# Patient Record
Sex: Male | Born: 1980 | Race: Black or African American | Hispanic: No | Marital: Married | Smoking: Never smoker
Health system: Southern US, Community
[De-identification: ages and names within clinical notes are randomized; demographics above are authoritative.]

## PROBLEM LIST (undated history)

## (undated) DIAGNOSIS — K50919 Crohn's disease, unspecified, with unspecified complications: Secondary | ICD-10-CM

## (undated) DIAGNOSIS — K4090 Unilateral inguinal hernia, without obstruction or gangrene, not specified as recurrent: Secondary | ICD-10-CM

## (undated) DIAGNOSIS — J302 Other seasonal allergic rhinitis: Secondary | ICD-10-CM

## (undated) DIAGNOSIS — K5090 Crohn's disease, unspecified, without complications: Secondary | ICD-10-CM

## (undated) HISTORY — PX: COLONOSCOPY: SHX174

## (undated) HISTORY — DX: Crohn's disease, unspecified, with unspecified complications: K50.919

---

## 2008-06-26 ENCOUNTER — Ambulatory Visit: Payer: Self-pay | Admitting: Internal Medicine

## 2008-07-16 ENCOUNTER — Ambulatory Visit: Payer: Self-pay | Admitting: Internal Medicine

## 2008-07-26 ENCOUNTER — Ambulatory Visit: Payer: Self-pay | Admitting: Internal Medicine

## 2008-09-17 ENCOUNTER — Ambulatory Visit: Payer: Self-pay | Admitting: Internal Medicine

## 2008-10-26 HISTORY — PX: HERNIA REPAIR: SHX51

## 2008-11-30 ENCOUNTER — Ambulatory Visit: Payer: Self-pay | Admitting: Internal Medicine

## 2008-12-24 ENCOUNTER — Ambulatory Visit: Payer: Self-pay | Admitting: Internal Medicine

## 2009-01-24 ENCOUNTER — Ambulatory Visit: Payer: Self-pay | Admitting: Internal Medicine

## 2010-02-13 ENCOUNTER — Encounter: Admission: RE | Admit: 2010-02-13 | Discharge: 2010-02-13 | Payer: Self-pay | Admitting: Gastroenterology

## 2010-03-26 ENCOUNTER — Encounter: Admission: RE | Admit: 2010-03-26 | Discharge: 2010-03-26 | Payer: Self-pay | Admitting: Gastroenterology

## 2010-04-08 ENCOUNTER — Encounter (HOSPITAL_COMMUNITY)
Admission: RE | Admit: 2010-04-08 | Discharge: 2010-07-07 | Payer: Self-pay | Source: Home / Self Care | Admitting: Gastroenterology

## 2010-07-31 ENCOUNTER — Encounter (HOSPITAL_COMMUNITY)
Admission: RE | Admit: 2010-07-31 | Discharge: 2010-09-25 | Payer: Self-pay | Source: Home / Self Care | Admitting: Gastroenterology

## 2012-12-26 ENCOUNTER — Emergency Department (HOSPITAL_COMMUNITY)
Admission: EM | Admit: 2012-12-26 | Discharge: 2012-12-26 | Disposition: A | Discharge status: Home Health | Payer: No Typology Code available for payment source | Attending: Emergency Medicine | Admitting: Emergency Medicine

## 2012-12-26 ENCOUNTER — Encounter (HOSPITAL_COMMUNITY): Payer: Self-pay | Admitting: *Deleted

## 2012-12-26 DIAGNOSIS — S161XXA Strain of muscle, fascia and tendon at neck level, initial encounter: Secondary | ICD-10-CM

## 2012-12-26 DIAGNOSIS — Y92410 Unspecified street and highway as the place of occurrence of the external cause: Secondary | ICD-10-CM | POA: Insufficient documentation

## 2012-12-26 DIAGNOSIS — Z8719 Personal history of other diseases of the digestive system: Secondary | ICD-10-CM | POA: Insufficient documentation

## 2012-12-26 DIAGNOSIS — S3981XA Other specified injuries of abdomen, initial encounter: Secondary | ICD-10-CM | POA: Insufficient documentation

## 2012-12-26 DIAGNOSIS — S139XXA Sprain of joints and ligaments of unspecified parts of neck, initial encounter: Secondary | ICD-10-CM | POA: Insufficient documentation

## 2012-12-26 DIAGNOSIS — S39012A Strain of muscle, fascia and tendon of lower back, initial encounter: Secondary | ICD-10-CM

## 2012-12-26 DIAGNOSIS — S335XXA Sprain of ligaments of lumbar spine, initial encounter: Secondary | ICD-10-CM | POA: Insufficient documentation

## 2012-12-26 DIAGNOSIS — Y9389 Activity, other specified: Secondary | ICD-10-CM | POA: Insufficient documentation

## 2012-12-26 MED ORDER — IBUPROFEN 600 MG PO TABS: 600.0000 mg | ORAL_TABLET | Freq: Four times a day (QID) | ORAL | Status: DC | PRN

## 2012-12-26 MED ORDER — IBUPROFEN 800 MG PO TABS
800.0000 mg | ORAL_TABLET | Freq: Once | ORAL | Status: AC
  Administered 2012-12-26: 800 mg via ORAL
  Filled 2012-12-26: qty 1

## 2012-12-26 MED ORDER — OXYCODONE-ACETAMINOPHEN 5-325 MG PO TABS: 1.0000 | ORAL_TABLET | Freq: Four times a day (QID) | ORAL | Status: DC | PRN

## 2012-12-26 MED ORDER — DIAZEPAM 5 MG PO TABS: 5.0000 mg | ORAL_TABLET | Freq: Four times a day (QID) | ORAL | Status: DC | PRN
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
## 2013-11-02 ENCOUNTER — Other Ambulatory Visit: Payer: Self-pay | Admitting: Gastroenterology

## 2013-11-02 DIAGNOSIS — R634 Abnormal weight loss: Secondary | ICD-10-CM

## 2013-11-02 DIAGNOSIS — K5090 Crohn's disease, unspecified, without complications: Secondary | ICD-10-CM

## 2013-11-08 ENCOUNTER — Other Ambulatory Visit (HOSPITAL_COMMUNITY): Payer: Self-pay | Admitting: *Deleted

## 2013-11-08 ENCOUNTER — Other Ambulatory Visit: Payer: Self-pay

## 2013-11-09 ENCOUNTER — Encounter (HOSPITAL_COMMUNITY)
Admission: RE | Admit: 2013-11-09 | Discharge: 2013-11-09 | Disposition: A | Discharge status: Home / Self Care | Payer: 59 | Source: Ambulatory Visit | Attending: Gastroenterology | Admitting: Gastroenterology

## 2013-11-09 DIAGNOSIS — K5090 Crohn's disease, unspecified, without complications: Secondary | ICD-10-CM | POA: Insufficient documentation

## 2013-11-09 MED ORDER — SODIUM CHLORIDE 0.9 % IV SOLN
5.0000 mg/kg | INTRAVENOUS | Status: DC
  Administered 2013-11-09: 300 mg via INTRAVENOUS
  Filled 2013-11-09: qty 30

## 2013-11-09 MED ORDER — SODIUM CHLORIDE 0.9 % IV SOLN
INTRAVENOUS | Status: DC
  Administered 2013-11-09: 09:00:00 via INTRAVENOUS

## 2013-11-10 ENCOUNTER — Ambulatory Visit
Admission: RE | Admit: 2013-11-10 | Discharge: 2013-11-10 | Disposition: A | Discharge status: Home / Self Care | Payer: 59 | Source: Ambulatory Visit | Attending: Gastroenterology | Admitting: Gastroenterology

## 2013-11-10 DIAGNOSIS — K5090 Crohn's disease, unspecified, without complications: Secondary | ICD-10-CM

## 2013-11-10 DIAGNOSIS — R634 Abnormal weight loss: Secondary | ICD-10-CM

## 2013-11-10 MED ORDER — IOHEXOL 300 MG/ML  SOLN
100.0000 mL | Freq: Once | INTRAMUSCULAR | Status: AC | PRN
  Administered 2013-11-10: 100 mL via INTRAVENOUS

## 2013-11-23 ENCOUNTER — Encounter (HOSPITAL_COMMUNITY)
Admission: RE | Admit: 2013-11-23 | Discharge: 2013-11-23 | Disposition: A | Discharge status: Home / Self Care | Payer: 59 | Source: Ambulatory Visit | Attending: Gastroenterology | Admitting: Gastroenterology

## 2013-11-23 MED ORDER — SODIUM CHLORIDE 0.9 % IV SOLN
5.0000 mg/kg | INTRAVENOUS | Status: DC
  Administered 2013-11-23: 300 mg via INTRAVENOUS
  Filled 2013-11-23: qty 30

## 2013-11-23 MED ORDER — SODIUM CHLORIDE 0.9 % IV SOLN
INTRAVENOUS | Status: DC
  Administered 2013-11-23: 10:00:00 via INTRAVENOUS

## 2013-12-21 ENCOUNTER — Inpatient Hospital Stay (HOSPITAL_COMMUNITY): Admission: RE | Admit: 2013-12-21 | Payer: 59 | Source: Ambulatory Visit

## 2014-01-02 ENCOUNTER — Ambulatory Visit (INDEPENDENT_AMBULATORY_CARE_PROVIDER_SITE_OTHER): Payer: 59 | Admitting: General Surgery

## 2014-01-12 ENCOUNTER — Encounter (HOSPITAL_COMMUNITY)
Admission: RE | Admit: 2014-01-12 | Discharge: 2014-01-12 | Disposition: A | Discharge status: Home / Self Care | Payer: 59 | Source: Ambulatory Visit | Attending: Gastroenterology | Admitting: Gastroenterology

## 2014-01-12 DIAGNOSIS — K5090 Crohn's disease, unspecified, without complications: Secondary | ICD-10-CM | POA: Insufficient documentation

## 2014-01-12 MED ORDER — SODIUM CHLORIDE 0.9 % IV SOLN
5.0000 mg/kg | INTRAVENOUS | Status: AC
  Administered 2014-01-12: 300 mg via INTRAVENOUS
  Filled 2014-01-12: qty 30

## 2014-01-12 MED ORDER — SODIUM CHLORIDE 0.9 % IV SOLN
INTRAVENOUS | Status: AC
  Administered 2014-01-12: 10:00:00 via INTRAVENOUS

## 2014-01-16 ENCOUNTER — Ambulatory Visit (INDEPENDENT_AMBULATORY_CARE_PROVIDER_SITE_OTHER): Payer: 59 | Admitting: General Surgery

## 2014-01-24 DIAGNOSIS — K4090 Unilateral inguinal hernia, without obstruction or gangrene, not specified as recurrent: Secondary | ICD-10-CM

## 2014-01-24 HISTORY — DX: Unilateral inguinal hernia, without obstruction or gangrene, not specified as recurrent: K40.90

## 2014-01-29 ENCOUNTER — Ambulatory Visit (INDEPENDENT_AMBULATORY_CARE_PROVIDER_SITE_OTHER): Payer: 59 | Admitting: General Surgery

## 2014-01-29 ENCOUNTER — Encounter (INDEPENDENT_AMBULATORY_CARE_PROVIDER_SITE_OTHER): Payer: Self-pay | Admitting: General Surgery

## 2014-01-29 VITALS — BP 121/70 | HR 89 | Temp 98.1°F | Resp 16 | Ht 71.0 in | Wt 159.6 lb

## 2014-01-29 DIAGNOSIS — K5010 Crohn's disease of large intestine without complications: Secondary | ICD-10-CM | POA: Insufficient documentation

## 2014-01-29 DIAGNOSIS — K50111 Crohn's disease of large intestine with rectal bleeding: Secondary | ICD-10-CM

## 2014-01-29 DIAGNOSIS — K4090 Unilateral inguinal hernia, without obstruction or gangrene, not specified as recurrent: Secondary | ICD-10-CM | POA: Insufficient documentation

## 2014-01-29 HISTORY — DX: Crohn's disease of large intestine with rectal bleeding: K50.111

## 2014-01-29 NOTE — Patient Instructions (Signed)
You have a very large left inguinal hernia, and you should have this surgically repaired in the future.  Be sure to get the colonoscopy with Dr. Collene Mares on April 15.  Discuss with Dr. Collene Mares twhen would be an ideal time to have the hernia surgery, possibly can be done between Remicade infusions.  We will schedule the surgery at your convenience.    Inguinal Hernia, Adult Muscles help keep everything in the body in its proper place. But if a weak spot in the muscles develops, something can poke through. That is called a hernia. When this happens in the lower part of the belly (abdomen), it is called an inguinal hernia. (It takes its name from a part of the body in this region called the inguinal canal.) A weak spot in the wall of muscles lets some fat or part of the small intestine bulge through. An inguinal hernia can develop at any age. Men get them more often than women. CAUSES  In adults, an inguinal hernia develops over time.  It can be triggered by:  Suddenly straining the muscles of the lower abdomen.  Lifting heavy objects.  Straining to have a bowel movement. Difficult bowel movements (constipation) can lead to this.  Constant coughing. This may be caused by smoking or lung disease.  Being overweight.  Being pregnant.  Working at a job that requires long periods of standing or heavy lifting.  Having had an inguinal hernia before. One type can be an emergency situation. It is called a strangulated inguinal hernia. It develops if part of the small intestine slips through the weak spot and cannot get back into the abdomen. The blood supply can be cut off. If that happens, part of the intestine may die. This situation requires emergency surgery. SYMPTOMS  Often, a small inguinal hernia has no symptoms. It is found when a healthcare provider does a physical exam. Larger hernias usually have symptoms.   In adults, symptoms may include:  A lump in the groin. This is easier to see  when the person is standing. It might disappear when lying down.  In men, a lump in the scrotum.  Pain or burning in the groin. This occurs especially when lifting, straining or coughing.  A dull ache or feeling of pressure in the groin.  Signs of a strangulated hernia can include:  A bulge in the groin that becomes very painful and tender to the touch.  A bulge that turns red or purple.  Fever, nausea and vomiting.  Inability to have a bowel movement or to pass gas. DIAGNOSIS  To decide if you have an inguinal hernia, a healthcare provider will probably do a physical examination.  This will include asking questions about any symptoms you have noticed.  The healthcare provider might feel the groin area and ask you to cough. If an inguinal hernia is felt, the healthcare provider may try to slide it back into the abdomen.  Usually no other tests are needed. TREATMENT  Treatments can vary. The size of the hernia makes a difference. Options include:  Watchful waiting. This is often suggested if the hernia is small and you have had no symptoms.  No medical procedure will be done unless symptoms develop.  You will need to watch closely for symptoms. If any occur, contact your healthcare provider right away.  Surgery. This is used if the hernia is larger or you have symptoms.  Open surgery. This is usually an outpatient procedure (you will not stay overnight  in a hospital). An cut (incision) is made through the skin in the groin. The hernia is put back inside the abdomen. The weak area in the muscles is then repaired by herniorrhaphy or hernioplasty. Herniorrhaphy: in this type of surgery, the weak muscles are sewn back together. Hernioplasty: a patch or mesh is used to close the weak area in the abdominal wall.  Laparoscopy. In this procedure, a surgeon makes small incisions. A thin tube with a tiny video camera (called a laparoscope) is put into the abdomen. The surgeon repairs the  hernia with mesh by looking with the video camera and using two long instruments. HOME CARE INSTRUCTIONS   After surgery to repair an inguinal hernia:  You will need to take pain medicine prescribed by your healthcare provider. Follow all directions carefully.  You will need to take care of the wound from the incision.  Your activity will be restricted for awhile. This will probably include no heavy lifting for several weeks. You also should not do anything too active for a few weeks. When you can return to work will depend on the type of job that you have.  During "watchful waiting" periods, you should:  Maintain a healthy weight.  Eat a diet high in fiber (fruits, vegetables and whole grains).  Drink plenty of fluids to avoid constipation. This means drinking enough water and other liquids to keep your urine clear or pale yellow.  Do not lift heavy objects.  Do not stand for long periods of time.  Quit smoking. This should keep you from developing a frequent cough. SEEK MEDICAL CARE IF:   A bulge develops in your groin area.  You feel pain, a burning sensation or pressure in the groin. This might be worse if you are lifting or straining.  You develop a fever of more than 100.5 F (38.1 C). SEEK IMMEDIATE MEDICAL CARE IF:   Pain in the groin increases suddenly.  A bulge in the groin gets bigger suddenly and does not go down.  For men, there is sudden pain in the scrotum. Or, the size of the scrotum increases.  A bulge in the groin area becomes red or purple and is painful to touch.  You have nausea or vomiting that does not go away.  You feel your heart beating much faster than normal.  You cannot have a bowel movement or pass gas.  You develop a fever of more than 102.0 F (38.9 C). Document Released: 02/28/2009 Document Revised: 01/04/2012 Document Reviewed: 02/28/2009 [facility] Patient Information 2014 [place], [place].

## 2014-01-29 NOTE — Progress Notes (Signed)
Patient ID: Gregory Meyer, male   DOB: 1981/05/30, 33 y.o.   MRN: [id number]  Chief Complaint  Patient presents with  . Hernia    HPI Gregory Meyer is a 33 y.o. male.  He is referred by Gregory Athens, NP,  who works with Dr. Glendale Chard. He is referred for evaluation of a large left inguinal hernia.  This young man has Crohn's colitis, and is followed by Dr. Nelwyn Meyer. He gets Remicade infusions, which has helped his Crohn's symptoms a good deal.  He has a history of a right inguinal hernia repair in [place] which healed well. He has a two-year history of an enlarging left inguinal hernia, occasionally painful but always reducible.  Socially he is single, has a girlfriend, quit smoking 6 weeks ago, drinks alcohol twice a week. Operates a food truck.   No other medical problems. No other immunologic diseases. Denies history of hepatitis or HIV. HPI  Past Medical History  Diagnosis Date  . Crohn's colitis     Past Surgical History  Procedure Laterality Date  . Hernia repair    . Colonoscopy      History reviewed. No pertinent family history.  Social History History  Substance Use Topics  . Smoking status: Former Smoker    Quit date: 12/01/2013  . Smokeless tobacco: Not on file  . Alcohol Use: Yes     Comment: 2 x per week    No Known Allergies  Current Outpatient Prescriptions  Medication Sig Dispense Refill  . InFLIXimab (REMICADE IV) Inject 5 mg into the vein.       No current facility-administered medications for this visit.    Review of Systems Review of Systems  Constitutional: Negative for fever, chills and unexpected weight change.  HENT: Negative for congestion, hearing loss, sore throat, trouble swallowing and voice change.   Eyes: Negative for visual disturbance.  Respiratory: Negative for cough and wheezing.   Cardiovascular: Negative for chest pain, palpitations and leg swelling.  Gastrointestinal: Positive for blood in stool. Negative  for nausea, vomiting, abdominal pain, diarrhea, constipation, abdominal distention, anal bleeding and rectal pain.  Genitourinary: Negative for hematuria and difficulty urinating.  Musculoskeletal: Negative for arthralgias.  Skin: Negative for rash and wound.  Neurological: Negative for seizures, syncope, weakness and headaches.  Hematological: Negative for adenopathy. Does not bruise/bleed easily.  Psychiatric/Behavioral: Negative for confusion.    Blood pressure 121/70, pulse 89, temperature 98.1 F (36.7 C), temperature source Temporal, resp. rate 16, height 5' 11"  (1.803 m), weight 159 lb 9.6 oz (72.394 kg).  Physical Exam Physical Exam  Constitutional: He is oriented to person, place, and time. He appears well-developed and well-nourished. No distress.  1Thin.   Cooperative. No distress. Mental status normal.  HENT:  Head: Normocephalic.  Nose: Nose normal.  Mouth/Throat: No oropharyngeal exudate.  Eyes: Conjunctivae and EOM are normal. Pupils are equal, round, and reactive to light. Right eye exhibits no discharge. Left eye exhibits no discharge. No scleral icterus.  Neck: Normal range of motion. Neck supple. No JVD present. No tracheal deviation present. No thyromegaly present.  Cardiovascular: Normal rate, regular rhythm, normal heart sounds and intact distal pulses.   No murmur heard. Pulmonary/Chest: Effort normal and breath sounds normal. No stridor. No respiratory distress. He has no wheezes. He has no rales. He exhibits no tenderness.  Abdominal: Soft. Bowel sounds are normal. He exhibits no distension and no mass. There is no tenderness. There is no rebound and no guarding.  Genitourinary:  Large left inguinal hernia, extends down and fills the left scrotum. Soft and reducible when supine. No evidence of hernia on the right. Perhaps a small scar on the right. Umbilicus normal.  Musculoskeletal: Normal range of motion. He exhibits no edema and no tenderness.   Lymphadenopathy:    He has no cervical adenopathy.  Neurological: He is alert and oriented to person, place, and time. He has normal reflexes. Coordination normal.  Skin: Skin is warm and dry. No rash noted. He is not diaphoretic. No erythema. No pallor.  Psychiatric: He has a normal mood and affect. His behavior is normal. Judgment and thought content normal.    Data Reviewed No data available.  Assessment    Large indirect left inguinal hernia. Elective repair is indicated  Crohn's colitis, on Remicade infusions  Past history right inguinal hernia repair, no evidence of recurrence  Tobacco abuse. Recent abstinence.     Plan    Scheduled for elective repair of his left inguinal hernia with mesh. Because of the significant size of this hernia, I will plan to do this as an open repair under general anesthesia.  I discussed the indications, details, techniques, and numerous risk of the surgery with him. He is aware of the risk of bleeding, infection, recurrence, nerve damage with chronic pain, injury to adjacent organs such as the testicle or bladder or intestine with reconstructive surgery, and other unforeseen problems.  Colonoscopy, elective,  planned for April 15  I asked him to speak with Dr. Collene Mares regarding his Remicade infusions. In terms of wound healing and infectious risk on TNF antagonists, I think that he might have a slightly increased risk of infection, but not much since it is a class I wound. There is no data on whether there will be impaired wound healing or increased recurrence.        Edsel Petrin. Dalbert Batman, M.D., [facility] General and Minimally invasive Surgery Breast and Colorectal Surgery Office:   [phone] Pager:   [phone]  01/29/2014, 5:42 PM

## 2014-02-21 ENCOUNTER — Encounter (HOSPITAL_BASED_OUTPATIENT_CLINIC_OR_DEPARTMENT_OTHER): Payer: Self-pay | Admitting: *Deleted

## 2014-02-23 ENCOUNTER — Encounter (HOSPITAL_COMMUNITY): Payer: 59

## 2014-02-26 NOTE — H&P (Signed)
Gregory Meyer    MRN:  [id number]   Description: 33 year old male  Provider: Adin Hector, MD  Department: [facility]              Diagnoses      Left inguinal hernia    -  Primary      550.90      Crohn's colitis          555.1                Current Vitals Most recent update: 01/29/2014  4:53 PM by Gregory Bean, MA      BP Pulse Temp(Src) Resp Ht Wt      121/70 89 98.1 F (36.7 C) (Temporal) 16 5' 11"  (1.803 m) 159 lb 9.6 oz (72.394 kg)      BMI 22.27 kg/m2                      History and Physical     Gregory Hector, MD     Status: Signed            Patient ID: Gregory Meyer, male   DOB: 1981/07/05, 33 y.o.   MRN: [id number]                 HPI Gregory Meyer is a 33 y.o. male.  He is referred by Gregory Athens, NP,  who works with Dr. Glendale Meyer. He is referred for evaluation of a large left inguinal hernia.   [name] has Crohn's colitis, and is followed by Gregory Meyer. He gets Remicade infusions, which has helped his Crohn's symptoms a good deal.  He has a history of a right inguinal hernia repair in [place] which healed well. He has a two-year history of an enlarging left inguinal hernia, occasionally painful but always reducible.   Socially he is single, has a girlfriend, quit smoking 6 weeks ago, drinks alcohol twice a week. Operates a food truck.    No other medical problems. No other immunologic diseases. Denies history of hepatitis or HIV.        Past Medical History   Diagnosis  Date   .  Crohn's colitis           Past Surgical History   Procedure  Laterality  Date   .  Hernia repair       .  Colonoscopy          Social History History   Substance Use Topics   .  Smoking status:  Former Smoker       Quit date:  12/01/2013   .  Smokeless tobacco:  Not on file   .  Alcohol Use:  Yes         Comment: 2 x per week        No Known Allergies    Current Outpatient  Prescriptions   Medication  Sig  Dispense  Refill   .  InFLIXimab (REMICADE IV)  Inject 5 mg into the vein.                    Review of Systems   Constitutional: Negative for fever, chills and unexpected weight change.  HENT: Negative for congestion, hearing loss, sore throat, trouble swallowing and voice change.   Eyes: Negative for visual disturbance.  Respiratory: Negative for cough and wheezing.   Cardiovascular: Negative for chest pain, palpitations and leg swelling.  Gastrointestinal:  Positive for blood in stool. Negative for nausea, vomiting, abdominal pain, diarrhea, constipation, abdominal distention, anal bleeding and rectal pain.  Genitourinary: Negative for hematuria and difficulty urinating.  Musculoskeletal: Negative for arthralgias.  Skin: Negative for rash and wound.  Neurological: Negative for seizures, syncope, weakness and headaches.  Hematological: Negative for adenopathy. Does not bruise/bleed easily.  Psychiatric/Behavioral: Negative for confusion.      Blood pressure 121/70, pulse 89, temperature 98.1 F (36.7 C), temperature source Temporal, resp. rate 16, height 5' 11"  (1.803 m), weight 159 lb 9.6 oz (72.394 kg).   Physical Exam  Constitutional: He is oriented to person, place, and time. He appears well-developed and well-nourished. No distress.  1Thin.   Cooperative. No distress. Mental status normal.  HENT:   Head: Normocephalic.   Nose: Nose normal.   Mouth/Throat: No oropharyngeal exudate.  Eyes: Conjunctivae and EOM are normal. Pupils are equal, round, and reactive to light. Right eye exhibits no discharge. Left eye exhibits no discharge. No scleral icterus.  Neck: Normal range of motion. Neck supple. No JVD present. No tracheal deviation present. No thyromegaly present.  Cardiovascular: Normal rate, regular rhythm, normal heart sounds and intact distal pulses.    No murmur heard. Pulmonary/Chest: Effort normal and breath sounds normal. No  stridor. No respiratory distress. He has no wheezes. He has no rales. He exhibits no tenderness.  Abdominal: Soft. Bowel sounds are normal. He exhibits no distension and no mass. There is no tenderness. There is no rebound and no guarding.  Genitourinary:  Large left inguinal hernia, extends down and fills the left scrotum. Soft and reducible when supine. No evidence of hernia on the right. Perhaps a small scar on the right. Umbilicus normal.  Musculoskeletal: Normal range of motion. He exhibits no edema and no tenderness.  Lymphadenopathy:    He has no cervical adenopathy.  Neurological: He is alert and oriented to person, place, and time. He has normal reflexes. Coordination normal.  Skin: Skin is warm and dry. No rash noted. He is not diaphoretic. No erythema. No pallor.  Psychiatric: He has a normal mood and affect. His behavior is normal. Judgment and thought content normal.        Assessment    Large indirect left inguinal hernia. Elective repair is indicated   Crohn's colitis, on Remicade infusions   Past history right inguinal hernia repair, no evidence of recurrence   Tobacco abuse. Recent abstinence.      Plan    Scheduled for elective repair of his left inguinal hernia with mesh. Because of the significant size of this hernia, I will plan to do this as an open repair under general anesthesia.   I discussed the indications, details, techniques, and numerous risk of the surgery with him. He is aware of the risk of bleeding, infection, recurrence, nerve damage with chronic pain, injury to adjacent organs such as the testicle or bladder or intestine with reconstructive surgery, and other unforeseen problems.   Colonoscopy, elective,  planned for April 15   I asked him to speak with Dr. Collene Meyer regarding his Remicade infusions. In terms of wound healing and infectious risk on TNF antagonists, I think that he might have a slightly increased risk of infection, but not much  since it is a class I wound. There is no data on whether there will be impaired wound healing or increased recurrence.           Gregory Meyer. Gregory Meyer, M.D., [facility] General and  Minimally invasive Surgery Breast and Colorectal Surgery Office:   [phone]           Patient Instructions      You have a very large left inguinal hernia, and you should have this surgically repaired in the future.   Be sure to get the colonoscopy with Dr. Collene Meyer on April 15.   Discuss with Dr. Collene Meyer twhen would be an ideal time to have the hernia surgery, possibly can be done between Remicade infusions.   We will schedule the surgery at your convenience.

## 2014-02-27 ENCOUNTER — Encounter (HOSPITAL_BASED_OUTPATIENT_CLINIC_OR_DEPARTMENT_OTHER)
Admission: RE | Admit: 2014-02-27 | Discharge: 2014-02-27 | Disposition: A | Discharge status: Home / Self Care | Payer: 59 | Source: Ambulatory Visit | Attending: General Surgery | Admitting: General Surgery

## 2014-02-27 LAB — URINALYSIS, ROUTINE W REFLEX MICROSCOPIC
GLUCOSE, UA: NEGATIVE mg/dL
HGB URINE DIPSTICK: NEGATIVE
Ketones, ur: 15 mg/dL — AB
LEUKOCYTES UA: NEGATIVE
Nitrite: NEGATIVE
Protein, ur: NEGATIVE mg/dL
SPECIFIC GRAVITY, URINE: 1.029 (ref 1.005–1.030)
Urobilinogen, UA: 0.2 mg/dL (ref 0.0–1.0)
pH: 5.5 (ref 5.0–8.0)

## 2014-02-27 LAB — COMPREHENSIVE METABOLIC PANEL
ALBUMIN: 3.7 g/dL (ref 3.5–5.2)
ALK PHOS: 103 U/L (ref 39–117)
ALT: 12 U/L (ref 0–53)
AST: 17 U/L (ref 0–37)
BUN: 14 mg/dL (ref 6–23)
CALCIUM: 9.4 mg/dL (ref 8.4–10.5)
CO2: 27 mEq/L (ref 19–32)
Chloride: 98 mEq/L (ref 96–112)
Creatinine, Ser: 0.89 mg/dL (ref 0.50–1.35)
GFR calc Af Amer: >90 mL/min (ref >90)
GFR calc non Af Amer: >90 mL/min (ref >90)
Glucose, Bld: 79 mg/dL (ref 70–99)
POTASSIUM: 4.1 meq/L (ref 3.7–5.3)
SODIUM: 140 meq/L (ref 137–147)
Total Bilirubin: 0.4 mg/dL (ref 0.3–1.2)
Total Protein: 8.6 g/dL — ABNORMAL HIGH (ref 6.0–8.3)

## 2014-02-27 LAB — CBC WITH DIFFERENTIAL/PLATELET
BASOS ABS: 0 10*3/uL (ref 0.0–0.1)
BASOS PCT: 0 % (ref 0–1)
EOS PCT: 0 % (ref 0–5)
Eosinophils Absolute: 0 10*3/uL (ref 0.0–0.7)
HCT: 36.7 % — ABNORMAL LOW (ref 39.0–52.0)
Hemoglobin: 12.1 g/dL — ABNORMAL LOW (ref 13.0–17.0)
LYMPHS ABS: 1.9 10*3/uL (ref 0.7–4.0)
Lymphocytes Relative: 31 % (ref 12–46)
MCH: 29 pg (ref 26.0–34.0)
MCHC: 33 g/dL (ref 30.0–36.0)
MCV: 88 fL (ref 78.0–100.0)
Monocytes Absolute: 0.4 10*3/uL (ref 0.1–1.0)
Monocytes Relative: 6 % (ref 3–12)
NEUTROS PCT: 63 % (ref 43–77)
Neutro Abs: 3.9 10*3/uL (ref 1.7–7.7)
PLATELETS: 267 10*3/uL (ref 150–400)
RBC: 4.17 MIL/uL — AB (ref 4.22–5.81)
RDW: 13.7 % (ref 11.5–15.5)
WBC: 6.2 10*3/uL (ref 4.0–10.5)

## 2014-02-27 LAB — PROTIME-INR
INR: 1.05 (ref 0.00–1.49)
Prothrombin Time: 13.5 seconds (ref 11.6–15.2)

## 2014-02-27 NOTE — Progress Notes (Signed)
Spoke with patient. He will come in for labwork this afternoon

## 2014-02-28 ENCOUNTER — Ambulatory Visit (HOSPITAL_BASED_OUTPATIENT_CLINIC_OR_DEPARTMENT_OTHER): Payer: 59 | Admitting: Anesthesiology

## 2014-02-28 ENCOUNTER — Encounter (HOSPITAL_BASED_OUTPATIENT_CLINIC_OR_DEPARTMENT_OTHER)
Admission: RE | Disposition: A | Discharge status: Home / Self Care | Payer: Self-pay | Source: Ambulatory Visit | Attending: General Surgery

## 2014-02-28 ENCOUNTER — Ambulatory Visit (HOSPITAL_COMMUNITY): Payer: 59

## 2014-02-28 ENCOUNTER — Ambulatory Visit (HOSPITAL_BASED_OUTPATIENT_CLINIC_OR_DEPARTMENT_OTHER)
Admission: RE | Admit: 2014-02-28 | Discharge: 2014-03-01 | Disposition: A | Discharge status: Home / Self Care | Payer: 59 | Source: Ambulatory Visit | Attending: General Surgery | Admitting: General Surgery

## 2014-02-28 ENCOUNTER — Encounter (HOSPITAL_BASED_OUTPATIENT_CLINIC_OR_DEPARTMENT_OTHER): Payer: 59 | Admitting: Anesthesiology

## 2014-02-28 ENCOUNTER — Encounter (HOSPITAL_BASED_OUTPATIENT_CLINIC_OR_DEPARTMENT_OTHER): Payer: Self-pay | Admitting: *Deleted

## 2014-02-28 DIAGNOSIS — K4090 Unilateral inguinal hernia, without obstruction or gangrene, not specified as recurrent: Secondary | ICD-10-CM

## 2014-02-28 DIAGNOSIS — K5090 Crohn's disease, unspecified, without complications: Secondary | ICD-10-CM | POA: Insufficient documentation

## 2014-02-28 DIAGNOSIS — Z01812 Encounter for preprocedural laboratory examination: Secondary | ICD-10-CM | POA: Insufficient documentation

## 2014-02-28 DIAGNOSIS — Z87891 Personal history of nicotine dependence: Secondary | ICD-10-CM | POA: Insufficient documentation

## 2014-02-28 DIAGNOSIS — Z8719 Personal history of other diseases of the digestive system: Secondary | ICD-10-CM

## 2014-02-28 DIAGNOSIS — Z9889 Other specified postprocedural states: Secondary | ICD-10-CM

## 2014-02-28 HISTORY — PX: INSERTION OF MESH: SHX5868

## 2014-02-28 HISTORY — DX: Unilateral inguinal hernia, without obstruction or gangrene, not specified as recurrent: K40.90

## 2014-02-28 HISTORY — PX: INGUINAL HERNIA REPAIR: SHX194

## 2014-02-28 HISTORY — DX: Other seasonal allergic rhinitis: J30.2

## 2014-02-28 HISTORY — DX: Crohn's disease, unspecified, without complications: K50.90

## 2014-02-28 SURGERY — REPAIR, HERNIA, INGUINAL, ADULT
Anesthesia: General | Site: Groin | Laterality: Left

## 2014-02-28 MED ORDER — OXYCODONE-ACETAMINOPHEN 5-325 MG PO TABS: 1.0000 | ORAL_TABLET | ORAL | Status: DC | PRN

## 2014-02-28 MED ORDER — ONDANSETRON HCL 4 MG/2ML IJ SOLN: 4.0000 mg | Freq: Once | INTRAMUSCULAR | Status: AC | PRN

## 2014-02-28 MED ORDER — MIDAZOLAM HCL 2 MG/2ML IJ SOLN
INTRAMUSCULAR | Status: AC
  Filled 2014-02-28: qty 2

## 2014-02-28 MED ORDER — CEFAZOLIN SODIUM-DEXTROSE 2-3 GM-% IV SOLR
INTRAVENOUS | Status: AC
  Filled 2014-02-28: qty 50

## 2014-02-28 MED ORDER — HYDROMORPHONE HCL PF 1 MG/ML IJ SOLN
INTRAMUSCULAR | Status: AC
  Filled 2014-02-28: qty 1

## 2014-02-28 MED ORDER — LIDOCAINE-EPINEPHRINE 0.5 %-1:200000 IJ SOLN
INTRAMUSCULAR | Status: AC
  Filled 2014-02-28: qty 1

## 2014-02-28 MED ORDER — LIDOCAINE HCL (CARDIAC) 20 MG/ML IV SOLN
INTRAVENOUS | Status: DC | PRN
  Administered 2014-02-28: 100 mg via INTRAVENOUS

## 2014-02-28 MED ORDER — MIDAZOLAM HCL 2 MG/2ML IJ SOLN
1.0000 mg | INTRAMUSCULAR | Status: DC | PRN
  Administered 2014-02-28: 2 mg via INTRAVENOUS

## 2014-02-28 MED ORDER — OXYCODONE HCL 5 MG/5ML PO SOLN: 5.0000 mg | Freq: Once | ORAL | Status: AC | PRN

## 2014-02-28 MED ORDER — OXYCODONE HCL 5 MG PO TABS
5.0000 mg | ORAL_TABLET | ORAL | Status: DC | PRN
  Administered 2014-02-28 – 2014-03-01 (×3): 10 mg via ORAL
  Filled 2014-02-28 (×3): qty 2

## 2014-02-28 MED ORDER — SODIUM BICARBONATE 4 % IV SOLN
INTRAVENOUS | Status: AC
  Filled 2014-02-28: qty 5

## 2014-02-28 MED ORDER — SODIUM CHLORIDE 0.9 % IV SOLN: 250.0000 mL | INTRAVENOUS | Status: DC | PRN

## 2014-02-28 MED ORDER — CHLORHEXIDINE GLUCONATE 4 % EX LIQD
1.0000 | Freq: Once | CUTANEOUS | Status: DC
Start: 2014-03-01 — End: 2014-03-01

## 2014-02-28 MED ORDER — ACETAMINOPHEN 650 MG RE SUPP: 650.0000 mg | RECTAL | Status: DC | PRN

## 2014-02-28 MED ORDER — SODIUM CHLORIDE 0.9 % IJ SOLN: 3.0000 mL | Freq: Two times a day (BID) | INTRAMUSCULAR | Status: DC

## 2014-02-28 MED ORDER — SODIUM CHLORIDE 0.9 % IV SOLN
INTRAVENOUS | Status: DC
  Administered 2014-02-28: 10 mL/h via INTRAVENOUS

## 2014-02-28 MED ORDER — MIDAZOLAM HCL 2 MG/ML PO SYRP: 12.0000 mg | ORAL_SOLUTION | Freq: Once | ORAL | Status: AC | PRN

## 2014-02-28 MED ORDER — OXYCODONE HCL 5 MG PO TABS
5.0000 mg | ORAL_TABLET | Freq: Once | ORAL | Status: AC | PRN
  Administered 2014-02-28: 5 mg via ORAL

## 2014-02-28 MED ORDER — OXYCODONE-ACETAMINOPHEN 7.5-325 MG PO TABS: 1.0000 | ORAL_TABLET | ORAL | Status: DC | PRN

## 2014-02-28 MED ORDER — HYDROMORPHONE HCL PF 1 MG/ML IJ SOLN
0.2500 mg | INTRAMUSCULAR | Status: DC | PRN
  Administered 2014-02-28 (×2): 0.5 mg via INTRAVENOUS

## 2014-02-28 MED ORDER — FENTANYL CITRATE 0.05 MG/ML IJ SOLN
INTRAMUSCULAR | Status: AC
  Filled 2014-02-28: qty 6

## 2014-02-28 MED ORDER — ROCURONIUM BROMIDE 100 MG/10ML IV SOLN
INTRAVENOUS | Status: DC | PRN
  Administered 2014-02-28: 20 mg via INTRAVENOUS
  Administered 2014-02-28: 10 mg via INTRAVENOUS

## 2014-02-28 MED ORDER — GLYCOPYRROLATE 0.2 MG/ML IJ SOLN
INTRAMUSCULAR | Status: DC | PRN
  Administered 2014-02-28: 0.4 mg via INTRAVENOUS

## 2014-02-28 MED ORDER — PROPOFOL 10 MG/ML IV BOLUS
INTRAVENOUS | Status: DC | PRN
  Administered 2014-02-28: 200 mg via INTRAVENOUS
  Administered 2014-02-28: 100 mg via INTRAVENOUS

## 2014-02-28 MED ORDER — NEOSTIGMINE METHYLSULFATE 10 MG/10ML IV SOLN
INTRAVENOUS | Status: DC | PRN
  Administered 2014-02-28: 3 mg via INTRAVENOUS

## 2014-02-28 MED ORDER — BUPIVACAINE LIPOSOME 1.3 % IJ SUSP
INTRAMUSCULAR | Status: AC
  Filled 2014-02-28: qty 20

## 2014-02-28 MED ORDER — SODIUM CHLORIDE 0.9 % IV SOLN: INTRAVENOUS | Status: DC

## 2014-02-28 MED ORDER — CEFAZOLIN SODIUM-DEXTROSE 2-3 GM-% IV SOLR
2.0000 g | INTRAVENOUS | Status: AC
Start: 2014-03-01 — End: 2014-02-28
  Administered 2014-02-28: 2 g via INTRAVENOUS

## 2014-02-28 MED ORDER — LACTATED RINGERS IV SOLN
INTRAVENOUS | Status: DC
  Administered 2014-02-28 (×3): via INTRAVENOUS

## 2014-02-28 MED ORDER — SUCCINYLCHOLINE CHLORIDE 20 MG/ML IJ SOLN
INTRAMUSCULAR | Status: DC | PRN
  Administered 2014-02-28: 100 mg via INTRAVENOUS

## 2014-02-28 MED ORDER — ACETAMINOPHEN 325 MG PO TABS: 650.0000 mg | ORAL_TABLET | ORAL | Status: DC | PRN

## 2014-02-28 MED ORDER — OXYCODONE HCL 5 MG PO TABS
ORAL_TABLET | ORAL | Status: AC
  Filled 2014-02-28: qty 1

## 2014-02-28 MED ORDER — FENTANYL CITRATE 0.05 MG/ML IJ SOLN: 25.0000 ug | INTRAMUSCULAR | Status: DC | PRN

## 2014-02-28 MED ORDER — SODIUM CHLORIDE 0.9 % IJ SOLN: 3.0000 mL | INTRAMUSCULAR | Status: DC | PRN

## 2014-02-28 MED ORDER — ESMOLOL HCL 10 MG/ML IV SOLN
INTRAVENOUS | Status: DC | PRN
  Administered 2014-02-28: 10 mg via INTRAVENOUS

## 2014-02-28 MED ORDER — DEXAMETHASONE SODIUM PHOSPHATE 4 MG/ML IJ SOLN
INTRAMUSCULAR | Status: DC | PRN
  Administered 2014-02-28: 10 mg via INTRAVENOUS

## 2014-02-28 MED ORDER — ONDANSETRON HCL 4 MG/2ML IJ SOLN: 4.0000 mg | Freq: Once | INTRAMUSCULAR | Status: DC

## 2014-02-28 MED ORDER — ONDANSETRON HCL 4 MG/2ML IJ SOLN
INTRAMUSCULAR | Status: DC | PRN
  Administered 2014-02-28: 4 mg via INTRAVENOUS

## 2014-02-28 MED ORDER — FENTANYL CITRATE 0.05 MG/ML IJ SOLN
INTRAMUSCULAR | Status: DC | PRN
Start: 2014-02-28 — End: 2014-02-28
  Administered 2014-02-28 (×3): 50 ug via INTRAVENOUS

## 2014-02-28 MED ORDER — FENTANYL CITRATE 0.05 MG/ML IJ SOLN
INTRAMUSCULAR | Status: AC
  Filled 2014-02-28: qty 2

## 2014-02-28 MED ORDER — FENTANYL CITRATE 0.05 MG/ML IJ SOLN
50.0000 ug | INTRAMUSCULAR | Status: DC | PRN
  Administered 2014-02-28: 100 ug via INTRAVENOUS

## 2014-02-28 MED ORDER — BUPIVACAINE-EPINEPHRINE (PF) 0.5% -1:200000 IJ SOLN
INTRAMUSCULAR | Status: AC
  Filled 2014-02-28: qty 30

## 2014-02-28 MED ORDER — LIDOCAINE-EPINEPHRINE (PF) 1 %-1:200000 IJ SOLN
INTRAMUSCULAR | Status: DC | PRN
  Administered 2014-02-28: 7 mL

## 2014-02-28 SURGICAL SUPPLY — 58 items
BENZOIN TINCTURE PRP APPL 2/3 (GAUZE/BANDAGES/DRESSINGS) IMPLANT
BLADE 10 SAFETY STRL DISP (BLADE) ×3 IMPLANT
BLADE HEX COATED 2.75 (ELECTRODE) ×3 IMPLANT
BLADE SURG ROTATE 9660 (MISCELLANEOUS) ×3 IMPLANT
CANISTER SUCT 1200ML W/VALVE (MISCELLANEOUS) ×3 IMPLANT
CHLORAPREP W/TINT 26ML (MISCELLANEOUS) ×3 IMPLANT
CLOSURE WOUND 1/2 X4 (GAUZE/BANDAGES/DRESSINGS)
COVER MAYO STAND STRL (DRAPES) ×3 IMPLANT
COVER TABLE BACK 60X90 (DRAPES) ×3 IMPLANT
DECANTER SPIKE VIAL GLASS SM (MISCELLANEOUS) IMPLANT
DERMABOND ADVANCED (GAUZE/BANDAGES/DRESSINGS) ×2
DERMABOND ADVANCED .7 DNX12 (GAUZE/BANDAGES/DRESSINGS) ×1 IMPLANT
DRAIN PENROSE 1/2X12 LTX STRL (WOUND CARE) ×3 IMPLANT
DRAPE LAPAROTOMY TRNSV 102X78 (DRAPE) IMPLANT
DRAPE PED LAPAROTOMY (DRAPES) ×3 IMPLANT
DRAPE UTILITY XL STRL (DRAPES) ×3 IMPLANT
ELECT REM PT RETURN 9FT ADLT (ELECTROSURGICAL) ×3
ELECTRODE REM PT RTRN 9FT ADLT (ELECTROSURGICAL) ×1 IMPLANT
GLOVE EUDERMIC 7 POWDERFREE (GLOVE) ×3 IMPLANT
GOWN STRL REUS W/ TWL LRG LVL3 (GOWN DISPOSABLE) ×1 IMPLANT
GOWN STRL REUS W/ TWL XL LVL3 (GOWN DISPOSABLE) ×1 IMPLANT
GOWN STRL REUS W/TWL LRG LVL3 (GOWN DISPOSABLE) ×2
GOWN STRL REUS W/TWL XL LVL3 (GOWN DISPOSABLE) ×2
MESH ULTRAPRO 3X6 7.6X15CM (Mesh General) ×3 IMPLANT
NEEDLE HYPO 22GX1.5 SAFETY (NEEDLE) ×3 IMPLANT
NEEDLE HYPO 25X1 1.5 SAFETY (NEEDLE) ×3 IMPLANT
NS IRRIG 1000ML POUR BTL (IV SOLUTION) ×3 IMPLANT
PACK BASIN DAY SURGERY FS (CUSTOM PROCEDURE TRAY) ×3 IMPLANT
PENCIL BUTTON HOLSTER BLD 10FT (ELECTRODE) ×3 IMPLANT
SLEEVE SCD COMPRESS KNEE MED (MISCELLANEOUS) ×3 IMPLANT
SPONGE GAUZE 4X4 12PLY STER LF (GAUZE/BANDAGES/DRESSINGS) IMPLANT
SPONGE LAP 4X18 X RAY DECT (DISPOSABLE) ×6 IMPLANT
STAPLER VISISTAT 35W (STAPLE) IMPLANT
STRIP CLOSURE SKIN 1/2X4 (GAUZE/BANDAGES/DRESSINGS) IMPLANT
SUT MNCRL AB 4-0 PS2 18 (SUTURE) ×3 IMPLANT
SUT PROLENE 1 CT (SUTURE) IMPLANT
SUT PROLENE 2 0 CT2 30 (SUTURE) ×9 IMPLANT
SUT SILK 2 0 SH (SUTURE) IMPLANT
SUT SILK 2 0 TIES 17X18 (SUTURE) ×2
SUT SILK 2-0 18XBRD TIE BLK (SUTURE) ×1 IMPLANT
SUT SILK 3 0 SH 30 (SUTURE) IMPLANT
SUT VIC AB 2-0 CT1 27 (SUTURE)
SUT VIC AB 2-0 CT1 TAPERPNT 27 (SUTURE) IMPLANT
SUT VIC AB 2-0 SH 27 (SUTURE) ×2
SUT VIC AB 2-0 SH 27XBRD (SUTURE) ×1 IMPLANT
SUT VIC AB 3-0 54X BRD REEL (SUTURE) IMPLANT
SUT VIC AB 3-0 BRD 54 (SUTURE)
SUT VIC AB 3-0 FS2 27 (SUTURE) IMPLANT
SUT VIC AB 3-0 SH 27 (SUTURE) ×2
SUT VIC AB 3-0 SH 27X BRD (SUTURE) ×1 IMPLANT
SUT VICRYL 3-0 CR8 SH (SUTURE) IMPLANT
SYR 20CC LL (SYRINGE) ×3 IMPLANT
SYR CONTROL 10ML LL (SYRINGE) ×3 IMPLANT
TOWEL OR NON WOVEN STRL DISP B (DISPOSABLE) ×3 IMPLANT
TRAY DSU PREP LF (CUSTOM PROCEDURE TRAY) ×3 IMPLANT
TUBE CONNECTING 20'X1/4 (TUBING) ×1
TUBE CONNECTING 20X1/4 (TUBING) ×2 IMPLANT
YANKAUER SUCT BULB TIP NO VENT (SUCTIONS) ×3 IMPLANT

## 2014-02-28 NOTE — Interval H&P Note (Signed)
History and Physical Interval Note:  02/28/2014 1:03 PM  Gregory Meyer  has presented today for surgery, with the diagnosis of LEFT INGUINAL HERNIA   The goals and the various methods of treatment have been discussed with the patient and family. After consideration of risks, benefits and other options for treatment, the patient has consented to  Procedure(s): HERNIA REPAIR INGUINAL ADULT (Left) INSERTION OF MESH (Left) as a surgical intervention .  The patient's history has been reviewed, patient examined today , no change in status, stable for surgery.  I have reviewed the patient's chart and labs.  Questions were answered to the patient's satisfaction.     Adin Hector

## 2014-02-28 NOTE — Anesthesia Postprocedure Evaluation (Signed)
  After some time in PACU pt developed difficulty in maintaining Sp02 without 2-3 l/min of nasal cannula 02.   Coughing and deep breathing  brings it up to 92-93% but he is unable to keep it up without O2.  CXR shows possible infiltrate in RUL with ?haziness on the L as well.  He is not SOB and has no productive cough.  I spoke with Dr.Wyatt who agreed to admit him to the [facility] overnight for observation.  We will check on him in the morning.

## 2014-02-28 NOTE — Anesthesia Procedure Notes (Addendum)
Procedure Name: Intubation Date/Time: 02/28/2014 1:33 PM Performed by: Lyndee Leo Pre-anesthesia Checklist: Patient identified, Emergency Drugs available, Suction available and Patient being monitored Patient Re-evaluated:Patient Re-evaluated prior to inductionOxygen Delivery Method: Circle System Utilized Preoxygenation: Pre-oxygenation with 100% oxygen Intubation Type: IV induction Ventilation: Mask ventilation without difficulty Laryngoscope Size: Mac and 3 Grade View: Grade III Tube type: Oral Tube size: 8.0 mm Number of attempts: 1 Airway Equipment and Method: stylet and oral airway Placement Confirmation: ETT inserted through vocal cords under direct vision,  positive ETCO2 and breath sounds checked- equal and bilateral Secured at: 24 cm Tube secured with: Tape Dental Injury: Teeth and Oropharynx as per pre-operative assessment     Anesthesia Regional Block:  TAP block  Pre-Anesthetic Checklist: ,, timeout performed, Correct Patient, Correct Site, Correct Laterality, Correct Procedure, Correct Position, site marked, Risks and benefits discussed,  Surgical consent,  Pre-op evaluation,  At surgeon's request and post-op pain management  Laterality: Left and Lower  Prep: chloraprep       Needles:  Injection technique: Single-shot  Needle Type: Echogenic Needle     Needle Length: 9cm 9 cm Needle Gauge: 21 and 21 G    Additional Needles:  Procedures: ultrasound guided (picture in chart) TAP block Narrative:  Start time: 02/28/2014 1:14 PM End time: 02/28/2014 1:19 PM Injection made incrementally with aspirations every 5 mL.  Performed by: Personally  Anesthesiologist: Lorrene Reid, MD

## 2014-02-28 NOTE — Discharge Instructions (Signed)
[facility] _______[facility]  UMBILICAL OR INGUINAL HERNIA REPAIR: POST OP INSTRUCTIONS  Always review your discharge instruction sheet given to you by the facility where your surgery was performed. IF YOU HAVE DISABILITY OR FAMILY LEAVE FORMS, YOU MUST BRING THEM TO THE OFFICE FOR PROCESSING.   DO NOT GIVE THEM TO YOUR DOCTOR.  1. A  prescription for pain medication may be given to you upon discharge.  Take your pain medication as prescribed, if needed.  If narcotic pain medicine is not needed, then you may take acetaminophen (Tylenol) or ibuprofen (Advil) as needed. 2. Take your usually prescribed medications unless otherwise directed. 3. If you need a refill on your pain medication, please contact your pharmacy.  They will contact our office to request authorization. Prescriptions will not be filled after 5 pm or on week-ends. 4. You should follow a light diet the first 24 hours after arrival home, such as soup and crackers, etc.  Be sure to include lots of fluids daily.  Resume your normal diet the day after surgery. 5. Most patients will experience some swelling and bruising around the umbilicus or in the groin and scrotum.  Ice packs and reclining will help.  Swelling and bruising can take several days to resolve.  6. It is common to experience some constipation if taking pain medication after surgery.  Increasing fluid intake and taking a stool softener (such as Colace) will usually help or prevent this problem from occurring.  A mild laxative (Milk of Magnesia or Miralax) should be taken according to package directions if there are no bowel movements after 48 hours. 7. Unless discharge instructions indicate otherwise, you may remove your bandages 24-48 hours after surgery, and you may shower at that time.  You may have steri-strips (small skin tapes) in place directly over the incision.  These strips should be left on the skin for 7-10 days.  If your surgeon used skin glue on the  incision, you may shower in 24 hours.  The glue will flake off over the next 2-3 weeks.  Any sutures or staples will be removed at the office during your follow-up visit. 8. ACTIVITIES:  You may resume regular (light) daily activities beginning the next day--such as daily self-care, walking, climbing stairs--gradually increasing activities as tolerated.  You may have sexual intercourse when it is comfortable.  Refrain from any heavy lifting or straining until approved by your doctor. a. You may drive when you are no longer taking prescription pain medication, you can comfortably wear a seatbelt, and you can safely maneuver your car and apply brakes. b. RETURN TO WORK:  __________________________________________________________ 9. You should see your doctor in the office for a follow-up appointment approximately 2-3 weeks after your surgery.  Make sure that you call for this appointment within a day or two after you arrive home to insure a convenient appointment time. 10. OTHER INSTRUCTIONS:  __________________________________________________________________________________________________________________________________________________________________________________________  WHEN TO CALL YOUR DOCTOR: 1. Fever over 101.0 2. Inability to urinate 3. Nausea and/or vomiting 4. Extreme swelling or bruising 5. Continued bleeding from incision. 6. Increased pain, redness, or drainage from the incision  The clinic staff is available to answer your questions during regular business hours.  Please dont hesitate to call and ask to speak to one of the nurses for clinical concerns.  If you have a medical emergency, go to the nearest emergency room or call 911.  A surgeon from [facility] is always on call at the hospital  [address], [place], [place], [place]  [address] ?  [address], [place], [place]   [address] [phone] ? [phone] ? FAX [phone] Web site:  [email or web address]    Post Anesthesia Home Care Instructions  Activity: Get plenty of rest for the remainder of the day. A responsible adult should stay with you for 24 hours following the procedure.  For the next 24 hours, DO NOT: -Drive a car -Paediatric nurse -Drink alcoholic beverages -Take any medication unless instructed by your physician -Make any legal decisions or sign important papers.  Meals: Start with liquid foods such as gelatin or soup. Progress to regular foods as tolerated. Avoid greasy, spicy, heavy foods. If nausea and/or vomiting occur, drink only clear liquids until the nausea and/or vomiting subsides. Call your physician if vomiting continues.  Special Instructions/Symptoms: Your throat may feel dry or sore from the anesthesia or the breathing tube placed in your throat during surgery. If this causes discomfort, gargle with warm salt water. The discomfort should disappear within 24 hours.

## 2014-02-28 NOTE — Anesthesia Postprocedure Evaluation (Signed)
  Anesthesia Post-op Note  Patient: Gregory Meyer  Procedure(s) Performed: Procedure(s): HERNIA REPAIR INGUINAL ADULT (Left) INSERTION OF MESH (Left)  Patient Location: PACU  Anesthesia Type:GA combined with regional for post-op pain  Level of Consciousness: awake, alert  and oriented  Airway and Oxygen Therapy: Patient Spontanous Breathing  Post-op Pain: mild  Post-op Assessment: Post-op Vital signs reviewed  Post-op Vital Signs: Reviewed  Last Vitals:  Filed Vitals:   02/28/14 1530  BP: 139/85  Pulse: 78  Temp:   Resp: 17    Complications: No apparent anesthesia complications

## 2014-02-28 NOTE — Progress Notes (Signed)
Assisted Dr. Al Corpus with left, ultrasound guided, transabdominal plane block. Side rails up, monitors on throughout procedure. See vital signs in flow sheet. Tolerated Procedure well.

## 2014-02-28 NOTE — Addendum Note (Signed)
Addendum created 02/28/14 1814 by Napoleon Form, MD   Modules edited: Notes Section   Notes Section:  File: [id number]

## 2014-02-28 NOTE — Transfer of Care (Signed)
Immediate Anesthesia Transfer of Care Note  Patient: Gregory Meyer  Procedure(s) Performed: Procedure(s): HERNIA REPAIR INGUINAL ADULT (Left) INSERTION OF MESH (Left)  Patient Location: PACU  Anesthesia Type:General  Level of Consciousness: sedated  Airway & Oxygen Therapy: Patient Spontanous Breathing and Patient connected to face mask oxygen  Post-op Assessment: Report given to PACU RN and Post -op Vital signs reviewed and stable  Post vital signs: Reviewed and stable  Complications: No apparent anesthesia complications

## 2014-02-28 NOTE — Anesthesia Preprocedure Evaluation (Addendum)
Anesthesia Evaluation  Patient identified by MRN, date of birth, ID band Patient awake    Reviewed: Allergy & Precautions, H&P , NPO status , Patient's Chart, lab work & pertinent test results  Airway Mallampati: I TM Distance: >3 FB Neck ROM: Full    Dental  (+) Teeth Intact, Dental Advisory Given   Pulmonary  breath sounds clear to auscultation        Cardiovascular Rhythm:Regular Rate:Normal     Neuro/Psych    GI/Hepatic   Endo/Other    Renal/GU      Musculoskeletal   Abdominal   Peds  Hematology   Anesthesia Other Findings   Reproductive/Obstetrics                           Anesthesia Physical Anesthesia Plan  ASA: II  Anesthesia Plan: General   Post-op Pain Management:    Induction: Intravenous  Airway Management Planned: Oral ETT  Additional Equipment:   Intra-op Plan:   Post-operative Plan: Extubation in OR  Informed Consent: I have reviewed the patients History and Physical, chart, labs and discussed the procedure including the risks, benefits and alternatives for the proposed anesthesia with the patient or authorized representative who has indicated his/her understanding and acceptance.   Dental advisory given  Plan Discussed with: CRNA, Anesthesiologist and Surgeon  Anesthesia Plan Comments:        Anesthesia Quick Evaluation

## 2014-02-28 NOTE — Op Note (Signed)
Patient Name:           Gregory Meyer   Date of Surgery:        02/28/2014  Pre op Diagnosis:      Left inguinal hernia  Post op Diagnosis:    Same  Procedure:                 Open repair left inguinal hernia with mesh Karl Pock repair)  Surgeon:                     Edsel Petrin. Dalbert Batman, M.D., FACS  Assistant:                      None  Operative Indications:   Gregory Meyer is a 33 y.o. male. He is referred by Cletis Athens, NP, who works with Dr. Glendale Chard. He is referred for evaluation of a large left inguinal hernia.  This young man has Crohn's colitis, and is followed by Dr. Nelwyn Salisbury. He gets Remicade infusions, which has helped his Crohn's symptoms a good deal. Last infusion in March.  He has a history of a right inguinal hernia repair in [place] which healed well. He has a two-year history of an enlarging left inguinal hernia, occasionally painful but always reducible.  Socially he is single, has a girlfriend, quit smoking 6 weeks ago, drinks alcohol twice a week. Operates a food truck.  No other medical problems. No other immunologic diseases. Denies history of hepatitis or HIV.   Operative Findings:       He had a large but reducible indirect left inguinal hernia. The hernia sac was very large and went almost down to but did not communicate with the testicle.  Procedure in Detail:          Following the induction of general endotracheal anesthesia the patient's abdomen and genitalia were prepped and draped in sterile fashion. Surgical time out was performed. Intravenous antibiotics were given. 0.5% Marcaine was given as local infiltration anesthetic in addition to a left TAPP block by Dr. Al Corpus.      A transverse incision was made in the left groin overlying the inguinal canal. Dissection was carried down through the subcutaneous tissue. The aponeurosis of the external oblique was exposed and incised in the direction of its fibers opening up the external inguinal  ring. The external oblique was dissected away from the underlying tissues and self-retaining retractors were placed. The ilioinguinal nerve was intimately associated with the cord and was dissected away from the cord structures I traced its emergence to the muscle laterally. The nerve was clamped at that site, divided, and ligated with 2-0 silk tie and the redundant nerve medially was resected. The cord structures were mobilized and encircled with a Penrose drain. Cremasteric muscle fibers were carefully skeletonized. He had a very large indirect sac which took some time to dissect away from the cord structures but ultimately we were able to enter the sac and dissected the sac away from all the cord structures all the back to the level of the internal ring. We could see small bowel below the level of the abdominal wall but there were no incarcerated contents. The indirect sac was closed with a pursestring suture of 2-0 Vicryl and the redundant sac excised and discarded. The internal ring was closed with interrupted sutures of 2-0 Vicryl. The floor of the inguinal canal was repaired and reinforced with a 3" x 6" piece of ultra Pro  mesh. The mesh was trimmed the corners to accommodate the anatomy of the wound. The mesh was sutured in  place with running sutures of 2-0 Prolene and interrupted mattress sutures of 2-0 Prolene. The mesh was sutured so as to generously  overlap the fascia at the pubic tubercle, and along the inguinal ligament inferiorly. Medially, superiorly, and superiolaterally several mattress sutures of 2-0 Prolene were placed. The mesh was incised laterally so as to wrap around the cord structures at the internal ring. The tails of the mesh were overlapped laterally and a few other sutures of 2-0 Prolene were placed. This provided very secure coverage and repair both medial and lateral to the internal ring to allow adequate fingertip opening for the cord structures. Hemostasis was excellent. This was  irrigated with saline. The external oblique was closed with a running suture of 2-0 Vicryl placing in the cord structures deep to the external oblique. Scarpa's fascia was closed with 3-0 Vicryl sutures and the skin closed with a running subcuticular suture of 4-0 Monocryl and Dermabond. Patient tolerated it well and was taken to PACU in stable condition. EBL 10 cc. Counts correct. Complications none.     Edsel Petrin. Dalbert Batman, M.D., FACS General and Minimally Invasive Surgery Breast and Colorectal Surgery  02/28/2014 2:55 PM

## 2014-03-01 ENCOUNTER — Encounter (HOSPITAL_BASED_OUTPATIENT_CLINIC_OR_DEPARTMENT_OTHER): Payer: Self-pay | Admitting: General Surgery

## 2014-03-05 ENCOUNTER — Emergency Department (HOSPITAL_COMMUNITY)
Admission: EM | Admit: 2014-03-05 | Discharge: 2014-03-05 | Disposition: A | Discharge status: Home / Self Care | Payer: 59 | Attending: Emergency Medicine | Admitting: Emergency Medicine

## 2014-03-05 ENCOUNTER — Emergency Department (HOSPITAL_COMMUNITY): Payer: 59

## 2014-03-05 ENCOUNTER — Encounter (HOSPITAL_COMMUNITY): Payer: Self-pay | Admitting: Emergency Medicine

## 2014-03-05 DIAGNOSIS — Z79899 Other long term (current) drug therapy: Secondary | ICD-10-CM | POA: Insufficient documentation

## 2014-03-05 DIAGNOSIS — Z9889 Other specified postprocedural states: Secondary | ICD-10-CM | POA: Insufficient documentation

## 2014-03-05 DIAGNOSIS — R509 Fever, unspecified: Secondary | ICD-10-CM | POA: Insufficient documentation

## 2014-03-05 DIAGNOSIS — K5090 Crohn's disease, unspecified, without complications: Secondary | ICD-10-CM | POA: Insufficient documentation

## 2014-03-05 DIAGNOSIS — R609 Edema, unspecified: Secondary | ICD-10-CM | POA: Insufficient documentation

## 2014-03-05 LAB — CBC WITH DIFFERENTIAL/PLATELET
Basophils Absolute: 0 10*3/uL (ref 0.0–0.1)
Basophils Relative: 0 % (ref 0–1)
Eosinophils Absolute: 0 10*3/uL (ref 0.0–0.7)
Eosinophils Relative: 0 % (ref 0–5)
HEMATOCRIT: 32.4 % — AB (ref 39.0–52.0)
HEMOGLOBIN: 11 g/dL — AB (ref 13.0–17.0)
LYMPHS PCT: 31 % (ref 12–46)
Lymphs Abs: 1.8 10*3/uL (ref 0.7–4.0)
MCH: 29.1 pg (ref 26.0–34.0)
MCHC: 34 g/dL (ref 30.0–36.0)
MCV: 85.7 fL (ref 78.0–100.0)
MONO ABS: 0.4 10*3/uL (ref 0.1–1.0)
MONOS PCT: 7 % (ref 3–12)
NEUTROS ABS: 3.6 10*3/uL (ref 1.7–7.7)
NEUTROS PCT: 62 % (ref 43–77)
Platelets: 248 10*3/uL (ref 150–400)
RBC: 3.78 MIL/uL — AB (ref 4.22–5.81)
RDW: 13.2 % (ref 11.5–15.5)
WBC: 5.8 10*3/uL (ref 4.0–10.5)

## 2014-03-05 LAB — BASIC METABOLIC PANEL
BUN: 12 mg/dL (ref 6–23)
CHLORIDE: 95 meq/L — AB (ref 96–112)
CO2: 26 meq/L (ref 19–32)
Calcium: 8.7 mg/dL (ref 8.4–10.5)
Creatinine, Ser: 0.8 mg/dL (ref 0.50–1.35)
GFR calc Af Amer: >90 mL/min (ref >90)
GFR calc non Af Amer: >90 mL/min (ref >90)
Glucose, Bld: 102 mg/dL — ABNORMAL HIGH (ref 70–99)
POTASSIUM: 3.5 meq/L — AB (ref 3.7–5.3)
Sodium: 134 mEq/L — ABNORMAL LOW (ref 137–147)

## 2014-03-05 LAB — URINALYSIS, ROUTINE W REFLEX MICROSCOPIC
Bilirubin Urine: NEGATIVE
Glucose, UA: NEGATIVE mg/dL
HGB URINE DIPSTICK: NEGATIVE
Ketones, ur: NEGATIVE mg/dL
Leukocytes, UA: NEGATIVE
Nitrite: NEGATIVE
PH: 5.5 (ref 5.0–8.0)
Protein, ur: NEGATIVE mg/dL
SPECIFIC GRAVITY, URINE: 1.008 (ref 1.005–1.030)
UROBILINOGEN UA: 0.2 mg/dL (ref 0.0–1.0)

## 2014-03-05 LAB — I-STAT CG4 LACTIC ACID, ED: LACTIC ACID, VENOUS: 0.55 mmol/L (ref 0.5–2.2)

## 2014-03-05 MED ORDER — ACETAMINOPHEN 325 MG PO TABS
650.0000 mg | ORAL_TABLET | Freq: Once | ORAL | Status: AC
  Administered 2014-03-05: 650 mg via ORAL
  Filled 2014-03-05: qty 2

## 2014-03-05 MED ORDER — ACETAMINOPHEN 325 MG PO TABS: 650.0000 mg | ORAL_TABLET | Freq: Four times a day (QID) | ORAL | Status: DC | PRN

## 2014-03-05 MED ORDER — SODIUM CHLORIDE 0.9 % IV BOLUS (SEPSIS)
1000.0000 mL | Freq: Once | INTRAVENOUS | Status: AC
  Administered 2014-03-05: 1000 mL via INTRAVENOUS

## 2014-03-05 NOTE — ED Provider Notes (Signed)
CSN: [id number]     Arrival date & time 03/05/14  0413 History   First MD Initiated Contact with Patient 03/05/14 [phone]     Chief Complaint  Patient presents with  . Fever  . Post-op Problem     (Consider location/radiation/quality/duration/timing/severity/associated sxs/prior Treatment) HPI Comments: 33 y/o with hx of crohns dz, recent inguinal hernia repair comes in w/ cc of fever. PT started appreciating fevers y'day evening. With the fever, patient denies any headaches, neck pain/stiffness, chest pain, cough, dib, abd pain, abscess drainage, redness around the surgical site, uti like sx. He has no hx of PE, DVT.  Patient is a 33 y.o. male presenting with fever. The history is provided by the patient.  Fever Associated symptoms: no chest pain, no cough and no dysuria     Past Medical History  Diagnosis Date  . Crohn's disease   . Seasonal allergies   . Inguinal hernia 01/2014    left   Past Surgical History  Procedure Laterality Date  . Colonoscopy      x 2-3  . Hernia repair Right 2010  . Inguinal hernia repair Left 02/28/2014    Procedure: HERNIA REPAIR INGUINAL ADULT;  Surgeon: Adin Hector, MD;  Location: [facility];  Service: General;  Laterality: Left;  . Insertion of mesh Left 02/28/2014    Procedure: INSERTION OF MESH;  Surgeon: Adin Hector, MD;  Location: [facility];  Service: General;  Laterality: Left;   No family history on file. History  Substance Use Topics  . Smoking status: Never Smoker   . Smokeless tobacco: Never Used  . Alcohol Use: Yes     Comment: 2 x/week    Review of Systems  Constitutional: Positive for fever. Negative for activity change and appetite change.  Respiratory: Negative for cough and shortness of breath.   Cardiovascular: Negative for chest pain.  Gastrointestinal: Negative for abdominal pain.  Genitourinary: Negative for dysuria.  All other systems reviewed and are negative.     Allergies   Review of patient's allergies indicates no known allergies.  Home Medications   Prior to Admission medications   Medication Sig Start Date End Date Taking? Authorizing Provider  acetaminophen (TYLENOL) 500 MG tablet Take 500 mg by mouth every 6 (six) hours as needed (pain).   Yes Historical Provider, MD  InFLIXimab (REMICADE IV) Inject 5 mg into the vein.   Yes Historical Provider, MD  loratadine (CLARITIN) 10 MG tablet Take 10 mg by mouth daily.   Yes Historical Provider, MD  NON FORMULARY Place 1 application rectally as needed (inflammation). Apply to rectum for inflammation   Yes Historical Provider, MD  oxyCODONE-acetaminophen (PERCOCET) 7.5-325 MG per tablet Take 1 tablet by mouth every 4 (four) hours as needed for pain. 02/28/14  Yes Adin Hector, MD   BP 119/68  Pulse 90  Temp(Src) 102.2 F (39 C) (Oral)  Resp 18  Ht 5' 11"  (1.803 m)  Wt 155 lb (70.308 kg)  BMI 21.63 kg/m2  SpO2 100% Physical Exam  Nursing note and vitals reviewed. Constitutional: He is oriented to person, place, and time. He appears well-developed.  HENT:  Head: Normocephalic and atraumatic.  Mouth/Throat: No oropharyngeal exudate.  Eyes: Conjunctivae and EOM are normal. Pupils are equal, round, and reactive to light.  Neck: Normal range of motion. Neck supple.  Cardiovascular: Normal rate and regular rhythm.   Pulmonary/Chest: Effort normal and breath sounds normal.  Abdominal: Soft. Bowel sounds are normal. He  exhibits no distension. There is no tenderness. There is no rebound and no guarding.  Musculoskeletal: He exhibits edema.  Lymphadenopathy:    He has no cervical adenopathy.  Neurological: He is alert and oriented to person, place, and time.  Skin: Skin is warm. No rash noted.    ED Course  Procedures (including critical care time) Labs Review Labs Reviewed  CBC WITH DIFFERENTIAL - Abnormal; Notable for the following:    RBC 3.78 (*)    Hemoglobin 11.0 (*)    HCT 32.4 (*)    All  other components within normal limits  CULTURE, BLOOD (ROUTINE X 2)  CULTURE, BLOOD (ROUTINE X 2)  BASIC METABOLIC PANEL  URINALYSIS, ROUTINE W REFLEX MICROSCOPIC  I-STAT CG4 LACTIC ACID, ED    Imaging Review No results found.   EKG Interpretation None      MDM   Final diagnoses:  None    Pt comes in with cc of fevers. Hx of crohns and is s/p inguinal hernia repair, pod #4.  Fever, with unknown source.  No signs of DVT, no leg pain. ROS is not indicative of any source of infection. More importantly, the abd exam x 2 times show no tenderness, and there is no signs of erythema or surgical wound infection.  Pt advised to return to the ER if symptoms get worse. Surgery to be notified about this visit.    Varney Biles, MD 03/05/14 [phone]

## 2014-03-05 NOTE — Discharge Instructions (Signed)
We saw you in the ER for the fevers. All the results in the ER are normal, labs and imaging. We are not sure what is causing your symptoms. Please return to the ER if your symptoms worsen; you have increased pain, fevers, chills, inability to keep any medications down, confusion, pain around the surgical site, pus drainage. Otherwise see the outpatient doctor as requested.   Fever, Adult A fever is a higher than normal body temperature. In an adult, an oral temperature around 98.6 F (37 C) is considered normal. A temperature of 100.4 F (38 C) or higher is generally considered a fever. Mild or moderate fevers generally have no long-term effects and often do not require treatment. Extreme fever (greater than or equal to 106 F or 41.1 C) can cause seizures. The sweating that may occur with repeated or prolonged fever may cause dehydration. Elderly people can develop confusion during a fever. A measured temperature can vary with:  Age.  Time of day.  Method of measurement (mouth, underarm, rectal, or ear). The fever is confirmed by taking a temperature with a thermometer. Temperatures can be taken different ways. Some methods are accurate and some are not.  An oral temperature is used most commonly. Electronic thermometers are fast and accurate.  An ear temperature will only be accurate if the thermometer is positioned as recommended by the manufacturer.  A rectal temperature is accurate and done for those adults who have a condition where an oral temperature cannot be taken.  An underarm (axillary) temperature is not accurate and not recommended. Fever is a symptom, not a disease.  CAUSES   Infections commonly cause fever.  Some noninfectious causes for fever include:  Some arthritis conditions.  Some thyroid or adrenal gland conditions.  Some immune system conditions.  Some types of cancer.  A medicine reaction.  High doses of certain street drugs such as  methamphetamine.  Dehydration.  Exposure to high outside or room temperatures.  Occasionally, the source of a fever cannot be determined. This is sometimes called a "fever of unknown origin" (FUO).  Some situations may lead to a temporary rise in body temperature that may go away on its own. Examples are:  Childbirth.  Surgery.  Intense exercise. HOME CARE INSTRUCTIONS   Take appropriate medicines for fever. Follow dosing instructions carefully. If you use acetaminophen to reduce the fever, be careful to avoid taking other medicines that also contain acetaminophen. Do not take aspirin for a fever if you are younger than age 61. There is an association with Reye's syndrome. Reye's syndrome is a rare but potentially deadly disease.  If an infection is present and antibiotics have been prescribed, take them as directed. Finish them even if you start to feel better.  Rest as needed.  Maintain an adequate fluid intake. To prevent dehydration during an illness with prolonged or recurrent fever, you may need to drink extra fluid.Drink enough fluids to keep your urine clear or pale yellow.  Sponging or bathing with room temperature water may help reduce body temperature. Do not use ice water or alcohol sponge baths.  Dress comfortably, but do not over-bundle. SEEK MEDICAL CARE IF:   You are unable to keep fluids down.  You develop vomiting or diarrhea.  You are not feeling at least partly better after 3 days.  You develop new symptoms or problems. SEEK IMMEDIATE MEDICAL CARE IF:   You have shortness of breath or trouble breathing.  You develop excessive weakness.  You are dizzy  or you faint.  You are extremely thirsty or you are making little or no urine.  You develop new pain that was not there before (such as in the head, neck, chest, back, or abdomen).  You have persistant vomiting and diarrhea for more than 1 to 2 days.  You develop a stiff neck or your eyes become  sensitive to light.  You develop a skin rash.  You have a fever or persistent symptoms for more than 2 to 3 days.  You have a fever and your symptoms suddenly get worse. MAKE SURE YOU:   Understand these instructions.  Will watch your condition.  Will get help right away if you are not doing well or get worse. Document Released: 04/07/2001 Document Revised: 01/04/2012 Document Reviewed: 08/13/2011 [facility] Patient Information 2014 [place], [place].

## 2014-03-05 NOTE — ED Notes (Signed)
Pt presents with fever onset yesterday 1700, pt states he did have hernia repair last Wednesday, pt states he did have post op hypoxia that resolved within 24 hours.

## 2014-03-08 ENCOUNTER — Ambulatory Visit (INDEPENDENT_AMBULATORY_CARE_PROVIDER_SITE_OTHER): Payer: 59 | Admitting: General Surgery

## 2014-03-08 ENCOUNTER — Encounter (INDEPENDENT_AMBULATORY_CARE_PROVIDER_SITE_OTHER): Payer: Self-pay | Admitting: General Surgery

## 2014-03-08 VITALS — BP 124/80 | HR 80 | Temp 97.0°F | Resp 14 | Ht 71.5 in | Wt 155.6 lb

## 2014-03-08 DIAGNOSIS — K5010 Crohn's disease of large intestine without complications: Secondary | ICD-10-CM

## 2014-03-08 DIAGNOSIS — K4090 Unilateral inguinal hernia, without obstruction or gangrene, not specified as recurrent: Secondary | ICD-10-CM

## 2014-03-08 NOTE — Progress Notes (Signed)
Patient ID: Gregory Meyer, male   DOB: 12-May-1981, 33 y.o.   MRN: [id number] History: This gentleman underwent open repair left inguinal hernia with mesh on May 6. He presented to the emergency department on May 11 with fever of 103 but they found no evidence of infection. Normal labs. Normal abdominal exam patient states that fever has resolved he feels fine. He is scheduled for Remicade infusion next week with Dr. Collene Mares  Exam: Patient looks well. No distress Abdomen soft and nontender Left groin incision is healing normally. Normal amount of thickening. No fluid collection. Hernia repair intact. Penis and testes normal  Assessment: Left inguinal hernia, recovering uneventfully following open repair with mesh Crohn's colitis. Follow by Dr. Collene Mares Recent febrile illness, resolved. Sounds like a viral illness. No evidence of wound infection  Plan: Okay to resume normal activities without obstruction after June 20 Okay to get Remicade infusion next week Return to see me as needed.   Edsel Petrin. Dalbert Batman, M.D., [facility] General and Minimally invasive Surgery Breast and Colorectal Surgery Office:   [phone] Pager:   [phone]

## 2014-03-08 NOTE — Patient Instructions (Signed)
You are recovering from your left inguinal hernia repair without any obvious surgical complications.  There is no evidence of infection and the hernia repair is intact.  You may resume normal activities including exercise approximately June 20.  You may get your Remicade infusion next week  Return to see Dr. Dalbert Batman as necessary.

## 2014-03-11 LAB — CULTURE, BLOOD (ROUTINE X 2)
CULTURE: NO GROWTH
Culture: NO GROWTH

## 2014-03-14 ENCOUNTER — Other Ambulatory Visit (HOSPITAL_COMMUNITY): Payer: Self-pay

## 2014-03-14 ENCOUNTER — Encounter (HOSPITAL_COMMUNITY)
Admission: RE | Admit: 2014-03-14 | Discharge: 2014-03-14 | Disposition: A | Discharge status: Home / Self Care | Payer: 59 | Source: Ambulatory Visit | Attending: Gastroenterology | Admitting: Gastroenterology

## 2014-03-14 DIAGNOSIS — K5090 Crohn's disease, unspecified, without complications: Secondary | ICD-10-CM | POA: Insufficient documentation

## 2014-03-14 MED ORDER — SODIUM CHLORIDE 0.9 % IV SOLN
5.0000 mg/kg | INTRAVENOUS | Status: DC
  Administered 2014-03-14: 400 mg via INTRAVENOUS
  Filled 2014-03-14: qty 40

## 2014-03-14 MED ORDER — SODIUM CHLORIDE 0.9 % IV SOLN
INTRAVENOUS | Status: DC
  Administered 2014-03-14: 11:00:00 via INTRAVENOUS

## 2014-03-15 ENCOUNTER — Encounter (INDEPENDENT_AMBULATORY_CARE_PROVIDER_SITE_OTHER): Payer: 59 | Admitting: General Surgery

## 2014-05-08 ENCOUNTER — Encounter (HOSPITAL_COMMUNITY)
Admission: RE | Admit: 2014-05-08 | Discharge: 2014-05-08 | Disposition: A | Discharge status: Home / Self Care | Payer: 59 | Source: Ambulatory Visit | Attending: Gastroenterology | Admitting: Gastroenterology

## 2014-05-08 DIAGNOSIS — K5090 Crohn's disease, unspecified, without complications: Secondary | ICD-10-CM | POA: Insufficient documentation

## 2014-05-08 MED ORDER — SODIUM CHLORIDE 0.9 % IV SOLN
5.0000 mg/kg | INTRAVENOUS | Status: AC
  Administered 2014-05-08: 400 mg via INTRAVENOUS
  Filled 2014-05-08: qty 40

## 2014-05-08 MED ORDER — SODIUM CHLORIDE 0.9 % IV SOLN
INTRAVENOUS | Status: AC
  Administered 2014-05-08: 10:00:00 via INTRAVENOUS

## 2014-05-08 MED ORDER — SODIUM CHLORIDE 0.9 % IV SOLN
5.0000 mg/kg | INTRAVENOUS | Status: DC
  Filled 2014-05-08: qty 40

## 2014-05-09 DIAGNOSIS — K5090 Crohn's disease, unspecified, without complications: Secondary | ICD-10-CM | POA: Diagnosis not present

## 2014-05-29 ENCOUNTER — Encounter (HOSPITAL_COMMUNITY): Payer: 59

## 2014-07-09 ENCOUNTER — Other Ambulatory Visit (HOSPITAL_COMMUNITY): Payer: Self-pay | Admitting: *Deleted

## 2014-07-10 ENCOUNTER — Encounter (HOSPITAL_COMMUNITY)
Admission: RE | Admit: 2014-07-10 | Discharge: 2014-07-10 | Disposition: A | Discharge status: Home / Self Care | Payer: 59 | Source: Ambulatory Visit | Attending: Gastroenterology | Admitting: Gastroenterology

## 2014-07-10 DIAGNOSIS — K5090 Crohn's disease, unspecified, without complications: Secondary | ICD-10-CM | POA: Insufficient documentation

## 2014-07-10 MED ORDER — SODIUM CHLORIDE 0.9 % IV SOLN
INTRAVENOUS | Status: DC
  Administered 2014-07-10: 10:00:00 via INTRAVENOUS

## 2014-07-10 MED ORDER — SODIUM CHLORIDE 0.9 % IV SOLN
5.0000 mg/kg | INTRAVENOUS | Status: DC
  Administered 2014-07-10: 400 mg via INTRAVENOUS
  Filled 2014-07-10: qty 40

## 2014-08-20 ENCOUNTER — Other Ambulatory Visit (HOSPITAL_COMMUNITY): Payer: Self-pay | Admitting: *Deleted

## 2014-08-21 ENCOUNTER — Encounter (HOSPITAL_COMMUNITY)
Admission: RE | Admit: 2014-08-21 | Discharge: 2014-08-21 | Disposition: A | Discharge status: Home / Self Care | Payer: 59 | Source: Ambulatory Visit | Attending: Gastroenterology | Admitting: Gastroenterology

## 2014-08-21 DIAGNOSIS — K5090 Crohn's disease, unspecified, without complications: Secondary | ICD-10-CM | POA: Insufficient documentation

## 2014-08-21 MED ORDER — SODIUM CHLORIDE 0.9 % IV SOLN
5.0000 mg/kg | INTRAVENOUS | Status: DC
  Administered 2014-08-21: 400 mg via INTRAVENOUS
  Filled 2014-08-21: qty 40

## 2014-08-21 MED ORDER — SODIUM CHLORIDE 0.9 % IV SOLN
INTRAVENOUS | Status: DC
  Administered 2014-08-21: 10:00:00 via INTRAVENOUS

## 2014-10-02 ENCOUNTER — Encounter (HOSPITAL_COMMUNITY)
Admission: RE | Admit: 2014-10-02 | Discharge: 2014-10-02 | Disposition: A | Discharge status: Home / Self Care | Payer: 59 | Source: Ambulatory Visit | Attending: Gastroenterology | Admitting: Gastroenterology

## 2014-10-02 DIAGNOSIS — K5090 Crohn's disease, unspecified, without complications: Secondary | ICD-10-CM | POA: Insufficient documentation

## 2014-10-02 MED ORDER — SODIUM CHLORIDE 0.9 % IV SOLN
INTRAVENOUS | Status: DC
  Administered 2014-10-02: 10:00:00 via INTRAVENOUS

## 2014-10-02 MED ORDER — SODIUM CHLORIDE 0.9 % IV SOLN
5.0000 mg/kg | INTRAVENOUS | Status: DC
  Administered 2014-10-02: 400 mg via INTRAVENOUS
  Filled 2014-10-02: qty 40

## 2014-11-13 ENCOUNTER — Encounter (HOSPITAL_COMMUNITY): Payer: 59

## 2014-11-19 ENCOUNTER — Other Ambulatory Visit (HOSPITAL_COMMUNITY): Payer: Self-pay | Admitting: *Deleted

## 2014-11-20 ENCOUNTER — Encounter (HOSPITAL_COMMUNITY)
Admission: RE | Admit: 2014-11-20 | Discharge: 2014-11-20 | Disposition: A | Discharge status: Home / Self Care | Payer: 59 | Source: Ambulatory Visit | Attending: Gastroenterology | Admitting: Gastroenterology

## 2014-11-20 DIAGNOSIS — K5090 Crohn's disease, unspecified, without complications: Secondary | ICD-10-CM | POA: Insufficient documentation

## 2014-11-20 MED ORDER — SODIUM CHLORIDE 0.9 % IV SOLN
INTRAVENOUS | Status: DC
  Administered 2014-11-20: 250 mL via INTRAVENOUS

## 2014-11-20 MED ORDER — SODIUM CHLORIDE 0.9 % IV SOLN
5.0000 mg/kg | INTRAVENOUS | Status: DC
  Administered 2014-11-20: 400 mg via INTRAVENOUS
  Filled 2014-11-20: qty 40

## 2014-12-31 ENCOUNTER — Encounter (HOSPITAL_COMMUNITY)
Admission: RE | Admit: 2014-12-31 | Discharge: 2014-12-31 | Disposition: A | Discharge status: Home / Self Care | Payer: 59 | Source: Ambulatory Visit | Attending: Gastroenterology | Admitting: Gastroenterology

## 2014-12-31 DIAGNOSIS — K5090 Crohn's disease, unspecified, without complications: Secondary | ICD-10-CM | POA: Insufficient documentation

## 2014-12-31 MED ORDER — SODIUM CHLORIDE 0.9 % IV SOLN
INTRAVENOUS | Status: DC
  Administered 2014-12-31: 10:00:00 via INTRAVENOUS

## 2014-12-31 MED ORDER — SODIUM CHLORIDE 0.9 % IV SOLN
5.0000 mg/kg | INTRAVENOUS | Status: DC
  Administered 2014-12-31: 400 mg via INTRAVENOUS
  Filled 2014-12-31: qty 40

## 2015-02-11 ENCOUNTER — Inpatient Hospital Stay (HOSPITAL_COMMUNITY): Admission: RE | Admit: 2015-02-11 | Payer: 59 | Source: Ambulatory Visit

## 2015-02-21 ENCOUNTER — Encounter (HOSPITAL_COMMUNITY)
Admission: RE | Admit: 2015-02-21 | Discharge: 2015-02-21 | Disposition: A | Discharge status: Home / Self Care | Payer: 59 | Source: Ambulatory Visit | Attending: Gastroenterology | Admitting: Gastroenterology

## 2015-02-21 DIAGNOSIS — K5090 Crohn's disease, unspecified, without complications: Secondary | ICD-10-CM | POA: Insufficient documentation

## 2015-02-21 MED ORDER — SODIUM CHLORIDE 0.9 % IV SOLN
INTRAVENOUS | Status: DC
  Administered 2015-02-21: 10:00:00 via INTRAVENOUS

## 2015-02-21 MED ORDER — SODIUM CHLORIDE 0.9 % IV SOLN
5.0000 mg/kg | INTRAVENOUS | Status: DC
  Administered 2015-02-21: 400 mg via INTRAVENOUS
  Filled 2015-02-21: qty 40

## 2015-04-01 ENCOUNTER — Encounter (HOSPITAL_COMMUNITY)
Admission: RE | Admit: 2015-04-01 | Discharge: 2015-04-01 | Disposition: A | Discharge status: Home / Self Care | Payer: 59 | Source: Ambulatory Visit | Attending: Gastroenterology | Admitting: Gastroenterology

## 2015-04-01 DIAGNOSIS — K5090 Crohn's disease, unspecified, without complications: Secondary | ICD-10-CM | POA: Insufficient documentation

## 2015-04-01 MED ORDER — SODIUM CHLORIDE 0.9 % IV SOLN
5.0000 mg/kg | INTRAVENOUS | Status: DC
  Administered 2015-04-01: 400 mg via INTRAVENOUS
  Filled 2015-04-01: qty 40

## 2015-04-01 MED ORDER — SODIUM CHLORIDE 0.9 % IV SOLN
INTRAVENOUS | Status: DC
  Administered 2015-04-01: 10:00:00 via INTRAVENOUS

## 2015-05-13 ENCOUNTER — Encounter (HOSPITAL_COMMUNITY)
Admission: RE | Admit: 2015-05-13 | Discharge: 2015-05-13 | Disposition: A | Discharge status: Home / Self Care | Payer: 59 | Source: Ambulatory Visit | Attending: Gastroenterology | Admitting: Gastroenterology

## 2015-05-13 DIAGNOSIS — K5090 Crohn's disease, unspecified, without complications: Secondary | ICD-10-CM | POA: Diagnosis not present

## 2015-05-13 MED ORDER — SODIUM CHLORIDE 0.9 % IV SOLN
5.0000 mg/kg | INTRAVENOUS | Status: DC
  Administered 2015-05-13: 400 mg via INTRAVENOUS
  Filled 2015-05-13: qty 40

## 2015-05-13 MED ORDER — SODIUM CHLORIDE 0.9 % IV SOLN
INTRAVENOUS | Status: DC
  Administered 2015-05-13: 11:00:00 via INTRAVENOUS

## 2015-06-24 ENCOUNTER — Encounter (HOSPITAL_COMMUNITY)
Admission: RE | Admit: 2015-06-24 | Discharge: 2015-06-24 | Disposition: A | Discharge status: Home / Self Care | Payer: 59 | Source: Ambulatory Visit | Attending: Gastroenterology | Admitting: Gastroenterology

## 2015-06-24 DIAGNOSIS — K5090 Crohn's disease, unspecified, without complications: Secondary | ICD-10-CM | POA: Insufficient documentation

## 2015-06-24 MED ORDER — SODIUM CHLORIDE 0.9 % IV SOLN
5.0000 mg/kg | Freq: Once | INTRAVENOUS | Status: AC
  Administered 2015-06-24: 400 mg via INTRAVENOUS
  Filled 2015-06-24: qty 40

## 2015-06-24 MED ORDER — SODIUM CHLORIDE 0.9 % IV SOLN
Freq: Once | INTRAVENOUS | Status: AC
  Administered 2015-06-24: 10:00:00 via INTRAVENOUS

## 2015-08-05 ENCOUNTER — Encounter (HOSPITAL_COMMUNITY)
Admission: RE | Admit: 2015-08-05 | Discharge: 2015-08-05 | Disposition: A | Discharge status: Home / Self Care | Payer: 59 | Source: Ambulatory Visit | Attending: Gastroenterology | Admitting: Gastroenterology

## 2015-08-05 DIAGNOSIS — K5090 Crohn's disease, unspecified, without complications: Secondary | ICD-10-CM | POA: Insufficient documentation

## 2015-08-05 MED ORDER — SODIUM CHLORIDE 0.9 % IV SOLN
Freq: Once | INTRAVENOUS | Status: AC
  Administered 2015-08-05: 10:00:00 via INTRAVENOUS

## 2015-08-05 MED ORDER — SODIUM CHLORIDE 0.9 % IV SOLN
5.0000 mg/kg | Freq: Once | INTRAVENOUS | Status: AC
  Administered 2015-08-05: 400 mg via INTRAVENOUS
  Filled 2015-08-05: qty 40

## 2015-09-16 ENCOUNTER — Encounter (HOSPITAL_COMMUNITY): Payer: 59

## 2015-09-23 ENCOUNTER — Inpatient Hospital Stay (HOSPITAL_COMMUNITY): Admission: RE | Admit: 2015-09-23 | Payer: Self-pay | Source: Ambulatory Visit

## 2016-05-13 ENCOUNTER — Encounter (HOSPITAL_COMMUNITY): Payer: Self-pay | Admitting: Family Medicine

## 2016-05-13 ENCOUNTER — Emergency Department (HOSPITAL_COMMUNITY): Payer: Self-pay

## 2016-05-13 ENCOUNTER — Emergency Department (HOSPITAL_COMMUNITY)
Admission: EM | Admit: 2016-05-13 | Discharge: 2016-05-13 | Disposition: A | Discharge status: Home / Self Care | Payer: Self-pay | Attending: Emergency Medicine | Admitting: Emergency Medicine

## 2016-05-13 DIAGNOSIS — K50911 Crohn's disease, unspecified, with rectal bleeding: Secondary | ICD-10-CM | POA: Insufficient documentation

## 2016-05-13 DIAGNOSIS — K50111 Crohn's disease of large intestine with rectal bleeding: Secondary | ICD-10-CM

## 2016-05-13 DIAGNOSIS — K600 Acute anal fissure: Secondary | ICD-10-CM | POA: Insufficient documentation

## 2016-05-13 DIAGNOSIS — K602 Anal fissure, unspecified: Secondary | ICD-10-CM

## 2016-05-13 DIAGNOSIS — Z79899 Other long term (current) drug therapy: Secondary | ICD-10-CM | POA: Insufficient documentation

## 2016-05-13 DIAGNOSIS — K644 Residual hemorrhoidal skin tags: Secondary | ICD-10-CM

## 2016-05-13 LAB — COMPREHENSIVE METABOLIC PANEL
ALT: 8 U/L — AB (ref 17–63)
AST: 13 U/L — ABNORMAL LOW (ref 15–41)
Albumin: 3 g/dL — ABNORMAL LOW (ref 3.5–5.0)
Alkaline Phosphatase: 68 U/L (ref 38–126)
Anion gap: 8 (ref 5–15)
BILIRUBIN TOTAL: 0.5 mg/dL (ref 0.3–1.2)
BUN: 6 mg/dL (ref 6–20)
CHLORIDE: 103 mmol/L (ref 101–111)
CO2: 26 mmol/L (ref 22–32)
Calcium: 9.3 mg/dL (ref 8.9–10.3)
Creatinine, Ser: 0.86 mg/dL (ref 0.61–1.24)
GFR calc Af Amer: >60 mL/min (ref >60)
Glucose, Bld: 132 mg/dL — ABNORMAL HIGH (ref 65–99)
Potassium: 3.3 mmol/L — ABNORMAL LOW (ref 3.5–5.1)
Sodium: 137 mmol/L (ref 135–145)
Total Protein: 8.3 g/dL — ABNORMAL HIGH (ref 6.5–8.1)

## 2016-05-13 LAB — LIPASE, BLOOD: LIPASE: 20 U/L (ref 11–51)

## 2016-05-13 LAB — CBC
HCT: 35.4 % — ABNORMAL LOW (ref 39.0–52.0)
Hemoglobin: 11.8 g/dL — ABNORMAL LOW (ref 13.0–17.0)
MCH: 28.2 pg (ref 26.0–34.0)
MCHC: 33.3 g/dL (ref 30.0–36.0)
MCV: 84.5 fL (ref 78.0–100.0)
PLATELETS: 348 10*3/uL (ref 150–400)
RBC: 4.19 MIL/uL — ABNORMAL LOW (ref 4.22–5.81)
RDW: 13.9 % (ref 11.5–15.5)
WBC: 7.6 10*3/uL (ref 4.0–10.5)

## 2016-05-13 MED ORDER — MORPHINE SULFATE (PF) 4 MG/ML IV SOLN
4.0000 mg | Freq: Once | INTRAVENOUS | Status: AC
  Administered 2016-05-13: 4 mg via INTRAVENOUS
  Filled 2016-05-13: qty 1

## 2016-05-13 MED ORDER — DEXAMETHASONE SODIUM PHOSPHATE 10 MG/ML IJ SOLN
10.0000 mg | Freq: Once | INTRAMUSCULAR | Status: AC
  Administered 2016-05-13: 10 mg via INTRAVENOUS
  Filled 2016-05-13: qty 1

## 2016-05-13 MED ORDER — SODIUM CHLORIDE 0.9 % IV BOLUS (SEPSIS)
1000.0000 mL | Freq: Once | INTRAVENOUS | Status: AC
  Administered 2016-05-13: 1000 mL via INTRAVENOUS

## 2016-05-13 MED ORDER — LIDOCAINE HCL 2 % EX GEL
1.0000 "application " | Freq: Once | CUTANEOUS | Status: AC
  Administered 2016-05-13: 1 via TOPICAL
  Filled 2016-05-13: qty 20

## 2016-05-13 MED ORDER — IOPAMIDOL (ISOVUE-300) INJECTION 61%
INTRAVENOUS | Status: AC
  Administered 2016-05-13: 100 mL
  Filled 2016-05-13: qty 100

## 2016-05-13 MED ORDER — CIPROFLOXACIN HCL 500 MG PO TABS
500.0000 mg | ORAL_TABLET | Freq: Once | ORAL | Status: AC
  Administered 2016-05-13: 500 mg via ORAL
  Filled 2016-05-13: qty 1

## 2016-05-13 MED ORDER — ONDANSETRON HCL 4 MG/2ML IJ SOLN
4.0000 mg | Freq: Once | INTRAMUSCULAR | Status: AC
Start: 2016-05-13 — End: 2016-05-13
  Administered 2016-05-13: 4 mg via INTRAVENOUS
  Filled 2016-05-13: qty 2

## 2016-05-13 MED ORDER — POTASSIUM CHLORIDE CRYS ER 20 MEQ PO TBCR
40.0000 meq | EXTENDED_RELEASE_TABLET | Freq: Once | ORAL | Status: AC
  Administered 2016-05-13: 40 meq via ORAL
  Filled 2016-05-13: qty 2

## 2016-05-13 MED ORDER — CIPROFLOXACIN HCL 500 MG PO TABS: 500.0000 mg | ORAL_TABLET | Freq: Two times a day (BID) | ORAL | Status: DC

## 2016-05-13 MED ORDER — METRONIDAZOLE 500 MG PO TABS: 500.0000 mg | ORAL_TABLET | Freq: Two times a day (BID) | ORAL | Status: DC

## 2016-05-13 MED ORDER — LIDOCAINE HCL 2 % EX GEL: 1.0000 "application " | CUTANEOUS | Status: DC | PRN

## 2016-05-13 MED ORDER — METRONIDAZOLE 500 MG PO TABS
500.0000 mg | ORAL_TABLET | Freq: Once | ORAL | Status: AC
  Administered 2016-05-13: 500 mg via ORAL
  Filled 2016-05-13: qty 1

## 2016-05-13 NOTE — ED Notes (Signed)
Pt ambulates independently and with steady gait at time of discharge. Discharge instructions and follow up information reviewed with patient. No other questions or concerns voiced at this time.  

## 2016-05-13 NOTE — ED Notes (Signed)
Pt here for chron's flare up. sts he hasn't had his remicade in a year. sts diarrhea with blood in stool and mucous. sts abd pain.

## 2016-05-13 NOTE — Discharge Instructions (Signed)
You were seen in the emergency room today for evaluation of ongoing GI issues. Your symptoms are likely related to your Crohn's. Your CT scan is stable from prior scans. I will give you two antibiotics. Finish both bottles as prescribed. Lidocaine jelly as needed for your rectal pain. Please follow up with GI and [facility] as soon as financially feasible. Please follow up with your primary care provider this week. Return to the ER for new or worsening symptoms.

## 2016-05-13 NOTE — ED Provider Notes (Signed)
CSN: [id number]     Arrival date & time 05/13/16  1116 History   First MD Initiated Contact with Patient 05/13/16 1145     Chief Complaint  Patient presents with  . Abdominal Pain  . Diarrhea   HPI   Gregory Meyer is an 35 y.o. male with history of Crohn's disease who presents to the ED for evaluation of abdominal pain and diarrhea. He states he stopped receiving remicade injections in 07/2015 due to lapse in insurance coverage. He states since then he has been unable to re-establish insurance and has not been receiving any kind of treatment for his Crohn's. He states for the past 3-4 months he has had loose, mucous stools sometimes with bright red blood. He states his rectum has progressively gotten more painful. Endorses chronic, intermittent diffuse abdominal pain. Denies nausea or vomiting. Denies fever or chills. Denies urinary symptoms. He has been unable to follow up with GI or surgery or PCP. He has not tried anything to alleviate his symptoms.   Past Medical History  Diagnosis Date  . Crohn's disease ([place])   . Seasonal allergies   . Inguinal hernia 01/2014    left   Past Surgical History  Procedure Laterality Date  . Colonoscopy      x 2-3  . Hernia repair Right 2010  . Inguinal hernia repair Left 02/28/2014    Procedure: HERNIA REPAIR INGUINAL ADULT;  Surgeon: Adin Hector, MD;  Location: [facility];  Service: General;  Laterality: Left;  . Insertion of mesh Left 02/28/2014    Procedure: INSERTION OF MESH;  Surgeon: Adin Hector, MD;  Location: [facility];  Service: General;  Laterality: Left;   History reviewed. No pertinent family history. Social History  Substance Use Topics  . Smoking status: Never Smoker   . Smokeless tobacco: Never Used  . Alcohol Use: Yes     Comment: 2 x/week    Review of Systems  All other systems reviewed and are negative.     Allergies  Review of patient's allergies indicates no known  allergies.  Home Medications   Prior to Admission medications   Medication Sig Start Date End Date Taking? Authorizing Provider  acetaminophen (TYLENOL) 500 MG tablet Take 1,000 mg by mouth every 6 (six) hours as needed (pain).    Yes Historical Provider, MD  loratadine (CLARITIN) 10 MG tablet Take 10 mg by mouth daily as needed for allergies.    Yes Historical Provider, MD  oxyCODONE-acetaminophen (PERCOCET) 7.5-325 MG per tablet Take 1 tablet by mouth every 4 (four) hours as needed for pain. Patient not taking: Reported on 05/13/2016 02/28/14   Fanny Skates, MD   BP 146/98 mmHg  Pulse 101  Temp(Src) 98.2 F (36.8 C) (Oral)  Resp 18  Ht 6' (1.829 m)  Wt 67.359 kg  BMI 20.14 kg/m2  SpO2 98% Physical Exam  Constitutional: He is oriented to person, place, and time.  HENT:  Right Ear: External ear normal.  Left Ear: External ear normal.  Nose: Nose normal.  Mouth/Throat: Oropharynx is clear and moist. No oropharyngeal exudate.  Eyes: Conjunctivae and EOM are normal. Pupils are equal, round, and reactive to light.  Neck: Normal range of motion. Neck supple.  Cardiovascular: Normal rate, regular rhythm, normal heart sounds and intact distal pulses.   Pulmonary/Chest: Effort normal and breath sounds normal. No respiratory distress. He has no wheezes. He exhibits no tenderness.  Abdominal: Soft. Bowel sounds are normal. He exhibits  no distension. There is no rebound and no guarding.  Mild diffuse abdominal tenderness to palpation without guarding. Abdomen soft. No CVA tenderness  Genitourinary: Rectal exam shows external hemorrhoid, fissure and tenderness. Rectal exam shows anal tone normal.  Perianal tear 12:00 position. Another wound at superior gluteal cleft.   Musculoskeletal: He exhibits no edema.  Neurological: He is alert and oriented to person, place, and time. No cranial nerve deficit.  Skin: Skin is warm and dry.  Psychiatric: He has a normal mood and affect.  Nursing note  and vitals reviewed.   ED Course  Procedures (including critical care time) Labs Review Labs Reviewed  COMPREHENSIVE METABOLIC PANEL - Abnormal; Notable for the following:    Potassium 3.3 (*)    Glucose, Bld 132 (*)    Total Protein 8.3 (*)    Albumin 3.0 (*)    AST 13 (*)    ALT 8 (*)    All other components within normal limits  CBC - Abnormal; Notable for the following:    RBC 4.19 (*)    Hemoglobin 11.8 (*)    HCT 35.4 (*)    All other components within normal limits  LIPASE, BLOOD    Imaging Review Ct Abdomen Pelvis W Contrast  05/13/2016  CLINICAL DATA:  History of Crohn disease. Off medicine for about 5 months and now with rectal pain and discharge. EXAM: CT ABDOMEN AND PELVIS WITH CONTRAST TECHNIQUE: Multidetector CT imaging of the abdomen and pelvis was performed using the standard protocol following bolus administration of intravenous contrast. CONTRAST:  167m ISOVUE-300 IOPAMIDOL (ISOVUE-300) INJECTION 61% COMPARISON:  11/10/2013. FINDINGS: Lower chest:  Unremarkable. Hepatobiliary: No focal abnormality within the liver parenchyma. There is no evidence for gallstones, gallbladder wall thickening, or pericholecystic fluid. No intrahepatic or extrahepatic biliary dilation. Pancreas: No focal mass lesion. No dilatation of the main duct. No intraparenchymal cyst. No peripancreatic edema. Spleen: No splenomegaly. No focal mass lesion. Adrenals/Urinary Tract: No adrenal nodule or mass. Kidneys have normal CT imaging features. No evidence for hydroureter. The urinary bladder appears normal for the degree of distention. Stomach/Bowel: Stomach is nondistended. No gastric wall thickening. No evidence of outlet obstruction. Duodenum is normally positioned as is the ligament of Treitz. No small bowel wall thickening. No small bowel dilatation. Assessment of bowel is limited by the lack of intravenous contrast material. The terminal ileum is mildly distended without evidence of wall  thickening. The appendix is normal. No gross colonic mass. No substantial diverticular change. Circumferential wall thickening is identified in the rectum with perirectal edema/inflammation. This is a similar appearance as noted on the previous CT. Vascular/Lymphatic: No abdominal aortic aneurysm. No abdominal aortic atherosclerotic calcification. There is no gastrohepatic or hepatoduodenal ligament lymphadenopathy. No intraperitoneal or retroperitoneal lymphadenopathy. No pelvic sidewall lymphadenopathy. Reproductive: The prostate gland and seminal vesicles have normal imaging features. Other: No intraperitoneal free fluid. Musculoskeletal: Patient appears to be status post bilateral inguinal hernia repair. Bone windows reveal no worrisome lytic or sclerotic osseous lesions. IMPRESSION: 1. Wall thickening in the rectum with perirectal edema/inflammation, similar to prior study of 11/10/2013. Imaging features are compatible with infectious/inflammatory disease. Otherwise no evidence for bowel wall thickening. No small bowel obstruction. No substantial intraperitoneal free fluid. Electronically Signed   By: EMisty StanleyM.D.   On: 05/13/2016 14:04   I have personally reviewed and evaluated these images and lab results as part of my medical decision-making.   EKG Interpretation None      MDM   Final diagnoses:  Crohn's colitis, with rectal bleeding ([place])  Anal fissure  External hemorrhoid   Labs unrevealing. CT abd/pelvis with wall thickening in rectum and perirectal edema similar to last scan in 2015. Consistent with infection/inflammation. No other acute process. Will treat with cipro/flagyl for symptom relief though discussed with pt ultimately he should f/u with GI when financially feasible to continue long term therapy. Lidocaine jelly as needed for anal fissures and hemorrhoids. Supportive therapies discussed. Encouraged GI and [facility] follow up. ER return precautions given.  Anne Ng,  PA-C 05/13/16 1942  Isla Pence, MD 05/14/16 [phone]

## 2016-06-25 ENCOUNTER — Encounter (HOSPITAL_COMMUNITY): Payer: Self-pay | Admitting: Emergency Medicine

## 2016-06-25 ENCOUNTER — Emergency Department (HOSPITAL_COMMUNITY)
Admission: EM | Admit: 2016-06-25 | Discharge: 2016-06-25 | Disposition: A | Discharge status: Home / Self Care | Payer: Self-pay | Attending: Emergency Medicine | Admitting: Emergency Medicine

## 2016-06-25 DIAGNOSIS — Z23 Encounter for immunization: Secondary | ICD-10-CM | POA: Insufficient documentation

## 2016-06-25 DIAGNOSIS — L03011 Cellulitis of right finger: Secondary | ICD-10-CM | POA: Insufficient documentation

## 2016-06-25 DIAGNOSIS — Z79899 Other long term (current) drug therapy: Secondary | ICD-10-CM | POA: Insufficient documentation

## 2016-06-25 MED ORDER — LIDOCAINE VISCOUS 2 % MT SOLN
15.0000 mL | Freq: Once | OROMUCOSAL | Status: AC
  Administered 2016-06-25: 15 mL via OROMUCOSAL
  Filled 2016-06-25: qty 15

## 2016-06-25 MED ORDER — CEPHALEXIN 500 MG PO CAPS: 500.0000 mg | ORAL_CAPSULE | Freq: Four times a day (QID) | ORAL | 0 refills | Status: DC

## 2016-06-25 MED ORDER — CEPHALEXIN 500 MG PO CAPS
500.0000 mg | ORAL_CAPSULE | Freq: Once | ORAL | Status: AC
  Administered 2016-06-25: 500 mg via ORAL
  Filled 2016-06-25: qty 1

## 2016-06-25 MED ORDER — SULFAMETHOXAZOLE-TRIMETHOPRIM 800-160 MG PO TABS
1.0000 | ORAL_TABLET | Freq: Once | ORAL | Status: AC
  Administered 2016-06-25: 1 via ORAL
  Filled 2016-06-25: qty 1

## 2016-06-25 MED ORDER — SULFAMETHOXAZOLE-TRIMETHOPRIM 800-160 MG PO TABS: 1.0000 | ORAL_TABLET | Freq: Two times a day (BID) | ORAL | 0 refills | Status: AC

## 2016-06-25 MED ORDER — IBUPROFEN 800 MG PO TABS
800.0000 mg | ORAL_TABLET | Freq: Once | ORAL | Status: AC
  Administered 2016-06-25: 800 mg via ORAL
  Filled 2016-06-25: qty 1

## 2016-06-25 MED ORDER — ACETAMINOPHEN 500 MG PO TABS
1000.0000 mg | ORAL_TABLET | Freq: Once | ORAL | Status: AC
  Administered 2016-06-25: 1000 mg via ORAL
  Filled 2016-06-25: qty 2

## 2016-06-25 MED ORDER — NAPROXEN 500 MG PO TABS: 500.0000 mg | ORAL_TABLET | Freq: Two times a day (BID) | ORAL | 0 refills | Status: DC

## 2016-06-25 MED ORDER — TETANUS-DIPHTH-ACELL PERTUSSIS 5-2.5-18.5 LF-MCG/0.5 IM SUSP
0.5000 mL | Freq: Once | INTRAMUSCULAR | Status: AC
Start: 2016-06-25 — End: 2016-06-25
  Administered 2016-06-25: 0.5 mL via INTRAMUSCULAR
  Filled 2016-06-25: qty 0.5

## 2016-06-25 NOTE — ED Triage Notes (Signed)
Pt states that approximately one week ago he noticed his right index finger swelling and had some pain.  Progressively has gotten worse.  Swelling and pain have continued to get worse.  Now finger appears discolored.  States he may have cut his finger while preparing meat prior to the initial onset.  Finger is discolored and swollen in triage.

## 2016-06-25 NOTE — ED Provider Notes (Addendum)
[facility] DEPT Provider Note   CSN: [id number] Arrival date & time: 06/25/16  0413     History   Chief Complaint Chief Complaint  Patient presents with  . Hand Pain    HPI Gregory Meyer is a 35 y.o. male.  The history is provided by the patient. No language interpreter was used.  Hand Pain  This is a new problem. The current episode started more than 1 week ago. The problem occurs constantly. The problem has been gradually worsening. Pertinent negatives include no chest pain. Nothing aggravates the symptoms. Nothing relieves the symptoms. He has tried nothing for the symptoms. The treatment provided no relief.    Past Medical History:  Diagnosis Date  . Crohn's disease ([place])   . Inguinal hernia 01/2014   left  . Seasonal allergies     Patient Active Problem List   Diagnosis Date Noted  . S/P inguinal hernia repair 02/28/2014  . Left inguinal hernia 01/29/2014  . Crohn's colitis ([place]) 01/29/2014    Past Surgical History:  Procedure Laterality Date  . COLONOSCOPY     x 2-3  . HERNIA REPAIR Right 2010  . INGUINAL HERNIA REPAIR Left 02/28/2014   Procedure: HERNIA REPAIR INGUINAL ADULT;  Surgeon: Adin Hector, MD;  Location: [facility];  Service: General;  Laterality: Left;  . INSERTION OF MESH Left 02/28/2014   Procedure: INSERTION OF MESH;  Surgeon: Adin Hector, MD;  Location: [facility];  Service: General;  Laterality: Left;       Home Medications    Prior to Admission medications   Medication Sig Start Date End Date Taking? Authorizing Provider  acetaminophen (TYLENOL) 500 MG tablet Take 1,000 mg by mouth every 6 (six) hours as needed (pain).     Historical Provider, MD  ciprofloxacin (CIPRO) 500 MG tablet Take 1 tablet (500 mg total) by mouth every 12 (twelve) hours. 05/13/16   Olivia Canter Sam, PA-C  lidocaine (XYLOCAINE) 2 % jelly Apply 1 application topically as needed. 05/13/16   Olivia Canter Sam, PA-C  loratadine  (CLARITIN) 10 MG tablet Take 10 mg by mouth daily as needed for allergies.     Historical Provider, MD  metroNIDAZOLE (FLAGYL) 500 MG tablet Take 1 tablet (500 mg total) by mouth 2 (two) times daily. 05/13/16   Anne Ng, PA-C  oxyCODONE-acetaminophen (PERCOCET) 7.5-325 MG per tablet Take 1 tablet by mouth every 4 (four) hours as needed for pain. Patient not taking: Reported on 05/13/2016 02/28/14   Fanny Skates, MD    Family History History reviewed. No pertinent family history.  Social History Social History  Substance Use Topics  . Smoking status: Never Smoker  . Smokeless tobacco: Never Used  . Alcohol use Yes     Comment: 2 x/week     Allergies   Review of patient's allergies indicates no known allergies.   Review of Systems Review of Systems  Constitutional: Negative for chills.  Cardiovascular: Negative for chest pain.  Musculoskeletal: Positive for arthralgias.  Skin: Positive for color change. Negative for rash.  All other systems reviewed and are negative.    Physical Exam Updated Vital Signs BP (!) 141/127 (BP Location: Left Arm)   Pulse 86   Temp 100.4 F (38 C) (Oral)   Resp 18   Ht 6' (1.829 m)   Wt 148 lb (67.1 kg)   SpO2 97%   BMI 20.07 kg/m   Physical Exam  Constitutional: He is oriented to person,  place, and time. He appears well-developed and well-nourished. No distress.  HENT:  Head: Normocephalic and atraumatic.  Mouth/Throat: No oropharyngeal exudate.  Eyes: EOM are normal. Pupils are equal, round, and reactive to light.  Neck: Normal range of motion. Neck supple.  Cardiovascular: Normal rate, regular rhythm and intact distal pulses.   Pulmonary/Chest: Effort normal and breath sounds normal. No respiratory distress.  Abdominal: Soft. Bowel sounds are normal. He exhibits no mass. There is no tenderness. There is no rebound and no guarding.  Musculoskeletal: Normal range of motion.       Hands: Neurological: He is alert and oriented to  person, place, and time. He has normal reflexes.  Skin: Skin is warm and dry. Capillary refill takes less than 2 seconds.     ED Treatments / Results  Labs (all labs ordered are listed, but only abnormal results are displayed) Labs Reviewed - No data to display  EKG  EKG Interpretation None       Radiology No results found.  Procedures Drain paronychia Date/Time: 06/25/2016 5:06 AM Performed by: Veatrice Kells Authorized by: Veatrice Kells  Consent: Verbal consent obtained. Patient consent: the patient's understanding of the procedure matches consent given Patient identity confirmed: arm band Preparation: Patient was prepped and draped in the usual sterile fashion.  Sedation: Patient sedated: no Patient tolerance: Patient tolerated the procedure well with no immediate complications Comments: Copious foul smelling drainage with deflation of the finger    (including critical care time)  Medications Ordered in ED Medications  sulfamethoxazole-trimethoprim (BACTRIM DS,SEPTRA DS) 800-160 MG per tablet 1 tablet (not administered)  cephALEXin (KEFLEX) capsule 500 mg (not administered)  lidocaine (XYLOCAINE) 2 % viscous mouth solution 15 mL (15 mLs Mouth/Throat Given 06/25/16 0454)  Tdap (BOOSTRIX) injection 0.5 mL (0.5 mLs Intramuscular Given 06/25/16 0447)  ibuprofen (ADVIL,MOTRIN) tablet 800 mg (800 mg Oral Given 06/25/16 0452)  acetaminophen (TYLENOL) tablet 1,000 mg (1,000 mg Oral Given 06/25/16 0452)   Initial Impression / Assessment and Plan / ED Course  I have reviewed the triage vital signs and the nursing notes.  Pertinent labs & imaging results that were available during my care of the patient were reviewed by me and considered in my medical decision making (see chart for details).  Clinical Course    No kanavels signs.  Take all antibiotics return in 48 hours if no improvement.  Follow up with hand surgery.  Patient verbalizes understanding and agrees to follow  upWill start antibiotics given time course, follow up with hand surgery for ongoing care. All questions answered to patient's satisfaction. Based on history and exam patient has been appropriately medically screened and emergency conditions excluded. Patient is stable for discharge at this time. Follow up with your PMD for recheck in 2 days and strict return precautions given   Final Clinical Impressions(s) / ED Diagnoses   Final diagnoses:  None    New Prescriptions New Prescriptions   No medications on file     Lizandra Zakrzewski, MD 06/25/16 0507    Hadiyah Maricle, MD 06/25/16 [phone]

## 2017-11-10 DIAGNOSIS — K50113 Crohn's disease of large intestine with fistula: Secondary | ICD-10-CM | POA: Diagnosis not present

## 2017-11-10 DIAGNOSIS — K604 Rectal fistula: Secondary | ICD-10-CM | POA: Diagnosis not present

## 2017-12-13 DIAGNOSIS — K50119 Crohn's disease of large intestine with unspecified complications: Secondary | ICD-10-CM | POA: Diagnosis not present

## 2017-12-27 DIAGNOSIS — Z79899 Other long term (current) drug therapy: Secondary | ICD-10-CM | POA: Diagnosis not present

## 2017-12-27 DIAGNOSIS — K50119 Crohn's disease of large intestine with unspecified complications: Secondary | ICD-10-CM | POA: Diagnosis not present

## 2018-02-07 DIAGNOSIS — K50119 Crohn's disease of large intestine with unspecified complications: Secondary | ICD-10-CM | POA: Diagnosis not present

## 2018-02-07 DIAGNOSIS — Z79899 Other long term (current) drug therapy: Secondary | ICD-10-CM | POA: Diagnosis not present

## 2018-03-25 DIAGNOSIS — J020 Streptococcal pharyngitis: Secondary | ICD-10-CM | POA: Diagnosis not present

## 2018-04-04 DIAGNOSIS — K50119 Crohn's disease of large intestine with unspecified complications: Secondary | ICD-10-CM | POA: Diagnosis not present

## 2018-04-04 DIAGNOSIS — Z79899 Other long term (current) drug therapy: Secondary | ICD-10-CM | POA: Diagnosis not present

## 2018-04-04 DIAGNOSIS — K50113 Crohn's disease of large intestine with fistula: Secondary | ICD-10-CM | POA: Diagnosis not present

## 2018-08-29 DIAGNOSIS — K50113 Crohn's disease of large intestine with fistula: Secondary | ICD-10-CM | POA: Diagnosis not present

## 2018-08-29 DIAGNOSIS — Z79899 Other long term (current) drug therapy: Secondary | ICD-10-CM | POA: Diagnosis not present

## 2019-05-25 ENCOUNTER — Ambulatory Visit: Payer: Self-pay | Admitting: Internal Medicine

## 2019-06-01 ENCOUNTER — Encounter: Payer: Self-pay | Admitting: Nurse Practitioner

## 2019-06-01 ENCOUNTER — Ambulatory Visit: Payer: BC Managed Care – PPO | Admitting: Nurse Practitioner

## 2019-06-01 ENCOUNTER — Other Ambulatory Visit: Payer: Self-pay

## 2019-06-01 VITALS — BP 106/80 | HR 103 | Temp 102.0°F | Ht 72.0 in | Wt 130.0 lb

## 2019-06-01 DIAGNOSIS — R000 Tachycardia, unspecified: Secondary | ICD-10-CM

## 2019-06-01 DIAGNOSIS — R509 Fever, unspecified: Secondary | ICD-10-CM

## 2019-06-01 DIAGNOSIS — K5010 Crohn's disease of large intestine without complications: Secondary | ICD-10-CM

## 2019-06-01 MED ORDER — METRONIDAZOLE 500 MG PO TABS: 500.0000 mg | ORAL_TABLET | Freq: Three times a day (TID) | ORAL | 0 refills | Status: AC

## 2019-06-01 NOTE — Progress Notes (Signed)
Subjective:     Patient ID: Gregory Meyer , male    DOB: 01-11-1981 , 37 y.o.   MRN: [id number]   Chief Complaint  Patient presents with  . Establish Care  . chrohns diease    patient needs a referral to ENT  . Fever    patient states he does not feel sick he stated he has been moving today he states he may be dehydrated.    HPI  Here to re-establish care he had been a patient here in 2015 but left due to insurance lapse.  He has not been seeing a primary care provider, had only been seeing a GI provider Dr. Tora Perches but was released due to no insurance.  He would like a referral to another GI provider Dr. Anell Barr in [place] who specializes in Chron's disease. He has taken Remicaide previously however when he took it last he reports it was not as effective. He has been having intermittent episodes of blood in his stool and when he wipes.  He reports he also has anal fissures.    He has also seen Dr. Collene Mares in the past but had challenges with insurance as well. He is currently working at [employer]  He has 2 children and is married.     Fever  This is a new problem. The current episode started today. The problem occurs constantly. The problem has been unchanged. The temperature was taken using an oral thermometer. Pertinent negatives include no abdominal pain or headaches. He has tried nothing for the symptoms.     Past Medical History:  Diagnosis Date  . Crohn's disease ([place])   . Inguinal hernia 01/2014   left  . Seasonal allergies      Family History  Problem Relation Age of Onset  . Cancer Mother   . Hypertension Mother   . Diabetes Father      Current Outpatient Medications:  .  acetaminophen (TYLENOL) 500 MG tablet, Take 1,000 mg by mouth every 6 (six) hours as needed (pain). , Disp: , Rfl:    No Known Allergies   Review of Systems  Constitutional: Positive for fever.  Respiratory: Negative.   Cardiovascular: Negative.   Gastrointestinal: Positive for  rectal pain (intermittent). Negative for abdominal pain.  Endocrine: Negative for polydipsia, polyphagia and polyuria.  Genitourinary: Negative.   Neurological: Negative for dizziness and headaches.     Today's Vitals   06/01/19 1559  BP: 106/80  Pulse: (!) 103  Temp: (!) 102 F (38.9 C)  TempSrc: Oral  Weight: 130 lb (59 kg)  Height: 6' (1.829 m)  PainSc: 4   PainLoc: Rectum   Body mass index is 17.63 kg/m.   Objective:  Physical Exam Constitutional:      Appearance: Normal appearance.  Cardiovascular:     Rate and Rhythm: Normal rate and regular rhythm.     Pulses: Normal pulses.     Heart sounds: Normal heart sounds. No murmur.  Pulmonary:     Effort: Pulmonary effort is normal.     Breath sounds: Normal breath sounds.  Abdominal:     General: Abdomen is flat. Bowel sounds are normal.     Palpations: Abdomen is soft.  Skin:    General: Skin is warm and dry.     Capillary Refill: Capillary refill takes less than 2 seconds.  Neurological:     General: No focal deficit present.     Mental Status: He is alert and oriented to person, place, and  time.  Psychiatric:        Mood and Affect: Mood normal.        Behavior: Behavior normal.        Thought Content: Thought content normal.        Judgment: Judgment normal.         Assessment And Plan:     1. Crohn's disease of colon without complication [facility])  He has been diagnosed since 2001. Has taken Remicaide in the past  Will refer him to Dr. Anell Barr in [facility] for management  Due to his increasing stools, blood in stools will treat with flagyl twice a day. - Ambulatory referral to Gastroenterology - CMP14 + Anion Gap - TSH  2. Fever, unspecified fever cause  Advised to take Tylenol for fever as needed and to take a dose when he gets home  Dehydration vs coronavirus vs colitis  Work note given to return on 06/06/2019 pending results of coronavirus test - CBC with Diff - Novel Coronavirus, NAA  ([facility])  3. Tachycardia  Likely related to the fever vs dehydration   He is advised to increase his fluids and to drink gatorade as well.     Minette Brine, FNP    THE PATIENT IS ENCOURAGED TO PRACTICE SOCIAL DISTANCING DUE TO THE COVID-19 PANDEMIC.

## 2019-06-01 NOTE — Patient Instructions (Signed)
Fever, Adult     A fever is an increase in your body's temperature. It often means a temperature of 100.63F (38C) or higher. Brief mild or moderate fevers often have no long-term effects. They often do not need treatment. Moderate or high fevers may make you feel uncomfortable. Sometimes, they can be a sign of a serious illness or disease. A fever that keeps coming back or that lasts a long time may cause you to lose water in your body (get dehydrated). You can take your temperature with a thermometer to see if you have a fever. Temperature can change with:  Age.  Time of day.  Where the thermometer is put in the body. Readings may vary when the thermometer is put: ? In the mouth (oral). ? In the butt (rectal). ? In the ear (tympanic). ? Under the arm (axillary). ? On the forehead (temporal). Follow these instructions at home: Medicines  Take over-the-counter and prescription medicines only as told by your doctor. Follow the dosing instructions carefully.  If you were prescribed an antibiotic medicine, take it as told by your doctor. Do not stop taking it even if you start to feel better. General instructions  Watch for any changes in your symptoms. Tell your doctor about them.  Rest as needed.  Drink enough fluid to keep your pee (urine) pale yellow.  Sponge yourself or bathe with room-temperature water as needed. This helps to lower your body temperature. Do not use ice water.  Do not use too many blankets or wear clothes that are too heavy.  If your fever was caused by an infection that spreads from person to person (is contagious), such as a cold or the flu: ? You should stay home from work and public places for at least 24 hours after your fever is gone. ? Your fever should be gone for at least 24 hours without the need to use medicines. Contact a doctor if:  You throw up (vomit).  You cannot eat or drink without throwing up.  You have watery poop (diarrhea).  It  hurts when you pee.  Your symptoms do not get better with treatment.  You have new symptoms.  You feel very weak. Get help right away if:  You are short of breath or have trouble breathing.  You are dizzy or you pass out (faint).  You feel mixed up (confused).  You have signs of not having enough water in your body, such as: ? Dark pee, very little pee, or no pee. ? Cracked lips. ? Dry mouth. ? Sunken eyes. ? Sleepiness. ? Weakness.  You have very bad pain in your belly (abdomen).  You keep throwing up or having watery poop.  You have a rash on your skin.  Your symptoms get worse all of a sudden. Summary  A fever is an increase in your body's temperature. It often means a temperature of 100.63F (38C) or higher.  Watch for any changes in your symptoms. Tell your doctor about them.  Take all medicines only as told by your doctor.  Do not go to work or other public places if your fever was caused by an illness that can spread to other people.  Get help right away if you have signs that you do not have enough water in your body. This information is not intended to replace advice given to you by your health care provider. Make sure you discuss any questions you have with your health care provider. Document Released: 07/21/2008  Document Revised: 03/28/2018 Document Reviewed: 03/28/2018 Elsevier Patient Education  [employer].  If you have shortness of breath go to the ER  Coronavirus (COVID-19) Are you at risk?  Are you at risk for the Coronavirus (COVID-19)?  To be considered HIGH RISK for Coronavirus (COVID-19), you have to meet the following criteria:  . Traveled to [place], [place], [place], [place] or [place]; or in the [place] to [place], [place], [place], or [place]; and have fever, cough, and shortness of breath within the last 2 weeks of travel OR . Been in close contact with a person diagnosed with COVID-19 within the last 2 weeks and have  fever, cough, and shortness of breath . IF YOU DO NOT MEET THESE CRITERIA, YOU ARE CONSIDERED LOW RISK FOR COVID-19.  What to do if you are HIGH RISK for COVID-19?  Marland Kitchen If you are having a medical emergency, call 911. . Seek medical care right away. Before you go to a doctor's office, urgent care or emergency department, call ahead and tell them about your recent travel, contact with someone diagnosed with COVID-19, and your symptoms. You should receive instructions from your physician's office regarding next steps of care.  . When you arrive at healthcare provider, tell the healthcare staff immediately you have returned from visiting [place], [place], [place], [place] or [place]; or traveled in the [place] to [place], [place], [place], or [place]; in the last two weeks or you have been in close contact with a person diagnosed with COVID-19 in the last 2 weeks.   . Tell the health care staff about your symptoms: fever, cough and shortness of breath. . After you have been seen by a medical provider, you will be either: o Tested for (COVID-19) and discharged home on quarantine except to seek medical care if symptoms worsen, and asked to  - Stay home and avoid contact with others until you get your results (4-5 days)  - Avoid travel on public transportation if possible (such as bus, train, or airplane) or o Sent to the Emergency Department by EMS for evaluation, COVID-19 testing, and possible admission depending on your condition and test results.  What to do if you are LOW RISK for COVID-19?  Reduce your risk of any infection by using the same precautions used for avoiding the common cold or flu:  Marland Kitchen Wash your hands often with soap and warm water for at least 20 seconds.  If soap and water are not readily available, use an alcohol-based hand sanitizer with at least 60% alcohol.  . If coughing or sneezing, cover your mouth and nose by coughing or sneezing into the elbow areas of your shirt  or coat, into a tissue or into your sleeve (not your hands). . Avoid shaking hands with others and consider head nods or verbal greetings only. . Avoid touching your eyes, nose, or mouth with unwashed hands.  . Avoid close contact with people who are sick. . Avoid places or events with large numbers of people in one location, like concerts or sporting events. . Carefully consider travel plans you have or are making. . If you are planning any travel outside or inside the [place], visit the CDC's Travelers' Health webpage for the latest health notices. . If you have some symptoms but not all symptoms, continue to monitor at home and seek medical attention if your symptoms worsen. . If you are having a medical emergency, call 911.   ADDITIONAL HEALTHCARE  [place] / e-Visit: [email or web address]         [facility]: [facility]: [phone]                   [facility]: [phone]

## 2019-06-02 ENCOUNTER — Other Ambulatory Visit: Payer: Self-pay

## 2019-06-02 DIAGNOSIS — Z20822 Contact with and (suspected) exposure to covid-19: Secondary | ICD-10-CM

## 2019-06-02 LAB — CMP14 + ANION GAP
ALT: 8 IU/L (ref 0–44)
AST: 11 IU/L (ref 0–40)
Albumin/Globulin Ratio: 1.1 — ABNORMAL LOW (ref 1.2–2.2)
Albumin: 3.7 g/dL — ABNORMAL LOW (ref 4.0–5.0)
Alkaline Phosphatase: 68 IU/L (ref 39–117)
Anion Gap: 14 mmol/L (ref 10.0–18.0)
BUN/Creatinine Ratio: 11 (ref 9–20)
BUN: 11 mg/dL (ref 6–20)
Bilirubin Total: 0.3 mg/dL (ref 0.0–1.2)
CO2: 26 mmol/L (ref 20–29)
Calcium: 9 mg/dL (ref 8.7–10.2)
Chloride: 97 mmol/L (ref 96–106)
Creatinine, Ser: 1 mg/dL (ref 0.76–1.27)
GFR calc Af Amer: 112 mL/min/{1.73_m2} (ref >59)
GFR calc non Af Amer: 97 mL/min/{1.73_m2} (ref >59)
Globulin, Total: 3.4 g/dL (ref 1.5–4.5)
Glucose: 72 mg/dL (ref 65–99)
Potassium: 3.9 mmol/L (ref 3.5–5.2)
Sodium: 137 mmol/L (ref 134–144)
Total Protein: 7.1 g/dL (ref 6.0–8.5)

## 2019-06-02 LAB — CBC WITH DIFFERENTIAL/PLATELET
Basophils Absolute: 0 10*3/uL (ref 0.0–0.2)
Basos: 1 %
EOS (ABSOLUTE): 0 10*3/uL (ref 0.0–0.4)
Eos: 0 %
Hematocrit: 31.5 % — ABNORMAL LOW (ref 37.5–51.0)
Hemoglobin: 10.1 g/dL — ABNORMAL LOW (ref 13.0–17.7)
Immature Grans (Abs): 0 10*3/uL (ref 0.0–0.1)
Immature Granulocytes: 0 %
Lymphocytes Absolute: 1.1 10*3/uL (ref 0.7–3.1)
Lymphs: 22 %
MCH: 27.2 pg (ref 26.6–33.0)
MCHC: 32.1 g/dL (ref 31.5–35.7)
MCV: 85 fL (ref 79–97)
Monocytes Absolute: 0.4 10*3/uL (ref 0.1–0.9)
Monocytes: 8 %
Neutrophils Absolute: 3.4 10*3/uL (ref 1.4–7.0)
Neutrophils: 69 %
Platelets: 206 10*3/uL (ref 150–450)
RBC: 3.71 x10E6/uL — ABNORMAL LOW (ref 4.14–5.80)
RDW: 13.4 % (ref 11.6–15.4)
WBC: 4.9 10*3/uL (ref 3.4–10.8)

## 2019-06-02 LAB — TSH: TSH: 0.609 u[IU]/mL (ref 0.450–4.500)

## 2019-06-03 LAB — NOVEL CORONAVIRUS, NAA: SARS-CoV-2, NAA: NOT DETECTED

## 2019-06-07 ENCOUNTER — Ambulatory Visit: Payer: BC Managed Care – PPO | Admitting: Gastroenterology

## 2019-06-07 ENCOUNTER — Encounter: Payer: Self-pay | Admitting: Gastroenterology

## 2019-06-07 ENCOUNTER — Other Ambulatory Visit: Payer: Self-pay

## 2019-06-07 ENCOUNTER — Telehealth: Payer: Self-pay | Admitting: Gastroenterology

## 2019-06-07 VITALS — BP 129/79 | HR 81 | Temp 98.2°F | Resp 17 | Ht 72.0 in | Wt 150.0 lb

## 2019-06-07 DIAGNOSIS — K5010 Crohn's disease of large intestine without complications: Secondary | ICD-10-CM

## 2019-06-07 DIAGNOSIS — K50111 Crohn's disease of large intestine with rectal bleeding: Secondary | ICD-10-CM

## 2019-06-07 MED ORDER — CIPROFLOXACIN HCL 500 MG PO TABS: 500.0000 mg | ORAL_TABLET | Freq: Two times a day (BID) | ORAL | 0 refills | Status: AC

## 2019-06-07 MED ORDER — HYDROCORTISONE (PERIANAL) 2.5 % EX CREA: TOPICAL_CREAM | Freq: Three times a day (TID) | CUTANEOUS | 1 refills | Status: AC

## 2019-06-07 MED ORDER — PREDNISONE 10 MG PO TABS: 40.0000 mg | ORAL_TABLET | Freq: Every day | ORAL | 0 refills | Status: AC

## 2019-06-07 NOTE — Telephone Encounter (Signed)
Patient called & wanted to know if he needs to continue taking his metroNIDAZOLE (FLAGYL) 500 MG with the new medication Dr Marius Ditch has placed him on today. He doesn't know the name of the new meds. Please call & if he does not answer please call l/m.

## 2019-06-07 NOTE — Progress Notes (Signed)
Gregory Darby, MD [address]  [place]  [place], [place] [address]  Main: [phone]  Fax: [phone]    Gastroenterology Consultation  Referring Provider:     Minette Meyer, [place] Primary Care Physician:  Gregory Brine, FNP Primary Gastroenterologist:  Dr. Cephas Meyer Reason for Consultation:     Crohn's disease of the rectum, perianal        HPI:   Gregory Meyer is a 38 y.o. male referred by Dr. Minette Brine, FNP  for consultation & management of Crohn's disease of the rectum, perianal Crohn's   Crohn's disease classification:  Age: 27 to 59 Location:colonic  Behavior: non stricturing, non penetrating  Perianal: Yes  IBD diagnosis: Patient is diagnosed with Crohn's disease when he was 38 years old, his symptoms were rectal bleeding, rectal pain, bloody diarrhea, rectal discharge, weight loss  Disease course: Right after diagnosis, patient was started on Remicade monotherapy.  Due to his insurance issues, he has been on and off Remicade to date.  The longest duration of Remicade was for 2 years without interruption.  His last Remicade dose was about 1 year ago.  He was receiving every 12 weeks when he was initially diagnosed and moved every 8 weeks.  He reports having breakthrough symptoms with 8 weeks interval on Remicade.  Patient was seeing Dr. Alessandra Meyer with [facility] gastroenterology as well as Dr. Collene Meyer in [place].  He lost insurance, therefore lost to follow-up.  Currently, he has insurance to his wife and wanted to see an IBD specialist in [place].  Patient reports that he was partially responding to Remicade.  He was receiving Flagyl as needed for rectal discharge which helps.  He does have severe rectal discomfort from external hemorrhoids which bleed from time to time.  His perianal skin is always sore and sometimes have to wear depends when discharge is worse.  He also has leakage of the stool.  He has been limiting his p.o. intake due to rectal  urgency, diarrhea.  He has 4-5 bowel movements daily.  He lost about 20 pounds within last 1 year.  He does have normocytic anemia.  Patient is started on Flagyl by his PCP  Extra intestinal manifestations: None  IBD surgical history: None  Imaging:  MRE none CTE in 2011, normal SBFT none  Procedures:  Colonoscopy reports having had a colonoscopy 2 years ago in [place], polyps were removed.  He was told he needs another colonoscopy in 2 years  Upper Endoscopy none  VCE none  IBD medications:  Steroids: Did not receive any prednisone, budesonide in the past 5-ASA: None  immunomodulators: AZA, methotrexate, nave TPMT status unknown Biologics: Anti TNFs: Remicade monotherapy, started at age 77, previously every 12 weeks then moved to every 8 weeks Anti Integrins: Ustekinumab: Tofactinib: Clinical trial:  NSAIDs: None  Antiplts/Anticoagulants/Anti thrombotics: None  His cousin has Crohn's disease Denies family history of GI malignancy  Past Medical History:  Diagnosis Date   Crohn's disease ([place])    Inguinal hernia 01/2014   left   Seasonal allergies     Past Surgical History:  Procedure Laterality Date   COLONOSCOPY     x 2-3   HERNIA REPAIR Right 2010   INGUINAL HERNIA REPAIR Left 02/28/2014   Procedure: HERNIA REPAIR INGUINAL ADULT;  Surgeon: Gregory Hector, MD;  Location: [facility];  Service: General;  Laterality: Left;   INSERTION OF MESH Left 02/28/2014   Procedure: INSERTION OF MESH;  Surgeon: Gregory Meyer  Gregory Grove, MD;  Location: [facility];  Service: General;  Laterality: Left;    Current Outpatient Medications:    acetaminophen (TYLENOL) 500 MG tablet, Take 1,000 mg by mouth every 6 (six) hours as needed (pain). , Disp: , Rfl:    metroNIDAZOLE (FLAGYL) 500 MG tablet, Take 1 tablet (500 mg total) by mouth 3 (three) times daily for 10 days., Disp: 30 tablet, Rfl: 0   ciprofloxacin (CIPRO) 500 MG tablet, Take 1  tablet (500 mg total) by mouth 2 (two) times daily for 14 days., Disp: 28 tablet, Rfl: 0   hydrocortisone (ANUSOL-HC) 2.5 % rectal cream, Place rectally 3 (three) times daily for 14 days., Disp: 30 g, Rfl: 1   predniSONE (DELTASONE) 10 MG tablet, Take 4 tablets (40 mg total) by mouth daily with breakfast., Disp: 120 tablet, Rfl: 0    Family History  Problem Relation Age of Onset   Cancer Mother    Hypertension Mother    Diabetes Father      Social History   Tobacco Use   Smoking status: Never Smoker   Smokeless tobacco: Never Used  Substance Use Topics   Alcohol use: Yes    Comment: occasionally    Drug use: Yes    Types: Marijuana    Comment: 05/09/19    Allergies as of 06/07/2019   (No Known Allergies)    Review of Systems:    All systems reviewed and negative except where noted in HPI.   Physical Exam:  BP 129/79 (BP Location: Left Arm, Patient Position: Sitting, Cuff Size: Normal)    Pulse 81    Temp 98.2 F (36.8 C)    Resp 17    Ht 6' (1.829 m)    Wt 150 lb (68 kg)    BMI 20.34 kg/m  No LMP for male patient.  General:   Alert, thin built, moderately nourished, pleasant and cooperative in NAD Head:  Normocephalic and atraumatic. Eyes:  Sclera clear, no icterus.   Conjunctiva pink. Ears:  Normal auditory acuity. Nose:  No deformity, discharge, or lesions. Mouth:  No deformity or lesions,oropharynx pink & moist. Neck:  Supple; no masses or thyromegaly. Lungs:  Respirations even and unlabored.  Clear throughout to auscultation.   No wheezes, crackles, or rhonchi. No acute distress. Heart:  Regular rate and rhythm; no murmurs, clicks, rubs, or gallops. Abdomen:  Normal bowel sounds. Soft, non-tender and non-distended without masses, hepatosplenomegaly or hernias noted.  No guarding or rebound tenderness.   Rectal: Inflamed external hemorrhoids, tender, blood-tinged, soft brown stool on the perianal skin, the perianal skin has nodular appearance, no obvious  fistulous identified Msk:  Symmetrical without gross deformities. Good, equal movement & strength bilaterally. Pulses:  Normal pulses noted. Extremities:  No clubbing or edema.  No cyanosis. Neurologic:  Alert and oriented x3;  grossly normal neurologically. Skin:  Intact without significant lesions or rashes. No jaundice. Lymph Nodes:  No significant cervical adenopathy. Psych:  Alert and cooperative. Normal mood and affect.  Imaging Studies: Reviewed  Assessment and Plan:   Gregory Meyer is a 38 y.o. male with Crohn's disease of the rectum diagnosed at age 65, previously on Remicade monotherapy presents with exacerbation of Crohn's.  Patient is currently off medication.  Patient had partial response to Remicade Continue metronidazole, will also add Cipro for 2 weeks Start prednisone Recommend MRI pelvis to evaluate for any perianal fistulas Recommend colonoscopy to assess disease severity Check labs today Will need to determine if  patient developed antibodies to Remicade before re-challenging   Follow up in 3 to 4 weeks   Gregory Darby, MD

## 2019-06-07 NOTE — Telephone Encounter (Signed)
Heather with the pre service ctr called to let us know [employer] REQUIRES a precert for MRI 06-05-02. (DR Marius Ditch)

## 2019-06-08 ENCOUNTER — Other Ambulatory Visit: Payer: Self-pay

## 2019-06-08 ENCOUNTER — Ambulatory Visit
Admission: RE | Admit: 2019-06-08 | Discharge: 2019-06-08 | Disposition: A | Discharge status: Home / Self Care | Payer: BC Managed Care – PPO | Source: Ambulatory Visit | Attending: Gastroenterology | Admitting: Gastroenterology

## 2019-06-08 DIAGNOSIS — K5010 Crohn's disease of large intestine without complications: Secondary | ICD-10-CM | POA: Diagnosis not present

## 2019-06-08 DIAGNOSIS — K5090 Crohn's disease, unspecified, without complications: Secondary | ICD-10-CM | POA: Diagnosis not present

## 2019-06-08 MED ORDER — GADOBUTROL 1 MMOL/ML IV SOLN
7.0000 mL | Freq: Once | INTRAVENOUS | Status: AC | PRN
  Administered 2019-06-08: 7 mL via INTRAVENOUS

## 2019-06-09 DIAGNOSIS — K50111 Crohn's disease of large intestine with rectal bleeding: Secondary | ICD-10-CM | POA: Diagnosis not present

## 2019-06-09 DIAGNOSIS — K5010 Crohn's disease of large intestine without complications: Secondary | ICD-10-CM | POA: Diagnosis not present

## 2019-06-10 LAB — HEPATITIS B SURFACE ANTIBODY,QUALITATIVE: Hep B Surface Ab, Qual: NONREACTIVE

## 2019-06-10 LAB — B12 AND FOLATE PANEL
Folate: 5.6 ng/mL (ref >3.0)
Vitamin B-12: 864 pg/mL (ref 232–1245)

## 2019-06-10 LAB — HEPATITIS A ANTIBODY, TOTAL: hep A Total Ab: NEGATIVE

## 2019-06-10 LAB — IRON AND TIBC
Iron Saturation: 19 % (ref 15–55)
Iron: 45 ug/dL (ref 38–169)
Total Iron Binding Capacity: 241 ug/dL — ABNORMAL LOW (ref 250–450)
UIBC: 196 ug/dL (ref 111–343)

## 2019-06-10 LAB — FERRITIN: Ferritin: 62 ng/mL (ref 30–400)

## 2019-06-12 ENCOUNTER — Other Ambulatory Visit: Payer: BC Managed Care – PPO | Attending: Gastroenterology

## 2019-06-12 ENCOUNTER — Other Ambulatory Visit (HOSPITAL_COMMUNITY)
Admission: RE | Admit: 2019-06-12 | Discharge: 2019-06-12 | Disposition: A | Discharge status: Home / Self Care | Payer: BC Managed Care – PPO | Source: Ambulatory Visit | Attending: Gastroenterology | Admitting: Gastroenterology

## 2019-06-12 DIAGNOSIS — Z01812 Encounter for preprocedural laboratory examination: Secondary | ICD-10-CM | POA: Diagnosis not present

## 2019-06-12 DIAGNOSIS — Z20828 Contact with and (suspected) exposure to other viral communicable diseases: Secondary | ICD-10-CM | POA: Diagnosis not present

## 2019-06-12 DIAGNOSIS — K50111 Crohn's disease of large intestine with rectal bleeding: Secondary | ICD-10-CM | POA: Diagnosis not present

## 2019-06-12 LAB — SARS CORONAVIRUS 2 (TAT 6-24 HRS): SARS Coronavirus 2: NEGATIVE

## 2019-06-13 LAB — QUANTIFERON-TB GOLD PLUS
QuantiFERON Mitogen Value: 0.06 IU/mL
QuantiFERON Nil Value: 0.01 IU/mL
QuantiFERON TB1 Ag Value: 0.01 IU/mL
QuantiFERON TB2 Ag Value: 0.01 IU/mL
QuantiFERON-TB Gold Plus: UNDETERMINED — AB

## 2019-06-15 ENCOUNTER — Ambulatory Visit: Payer: BC Managed Care – PPO | Admitting: Certified Registered"

## 2019-06-15 ENCOUNTER — Encounter: Payer: Self-pay | Admitting: Anesthesiology

## 2019-06-15 ENCOUNTER — Telehealth: Payer: Self-pay | Admitting: Gastroenterology

## 2019-06-15 ENCOUNTER — Encounter
Admission: RE | Disposition: A | Discharge status: Home / Self Care | Payer: Self-pay | Source: Home / Self Care | Attending: Gastroenterology

## 2019-06-15 ENCOUNTER — Other Ambulatory Visit: Payer: Self-pay

## 2019-06-15 ENCOUNTER — Ambulatory Visit
Admission: RE | Admit: 2019-06-15 | Discharge: 2019-06-15 | Disposition: A | Discharge status: Home / Self Care | Payer: BC Managed Care – PPO | Attending: Gastroenterology | Admitting: Gastroenterology

## 2019-06-15 DIAGNOSIS — Z1211 Encounter for screening for malignant neoplasm of colon: Secondary | ICD-10-CM | POA: Diagnosis not present

## 2019-06-15 DIAGNOSIS — K5010 Crohn's disease of large intestine without complications: Secondary | ICD-10-CM

## 2019-06-15 DIAGNOSIS — Z809 Family history of malignant neoplasm, unspecified: Secondary | ICD-10-CM | POA: Diagnosis not present

## 2019-06-15 DIAGNOSIS — K50111 Crohn's disease of large intestine with rectal bleeding: Secondary | ICD-10-CM

## 2019-06-15 DIAGNOSIS — K649 Unspecified hemorrhoids: Secondary | ICD-10-CM | POA: Insufficient documentation

## 2019-06-15 DIAGNOSIS — Z8249 Family history of ischemic heart disease and other diseases of the circulatory system: Secondary | ICD-10-CM | POA: Diagnosis not present

## 2019-06-15 DIAGNOSIS — Z79899 Other long term (current) drug therapy: Secondary | ICD-10-CM | POA: Insufficient documentation

## 2019-06-15 DIAGNOSIS — Z833 Family history of diabetes mellitus: Secondary | ICD-10-CM | POA: Diagnosis not present

## 2019-06-15 DIAGNOSIS — K529 Noninfective gastroenteritis and colitis, unspecified: Secondary | ICD-10-CM | POA: Diagnosis not present

## 2019-06-15 DIAGNOSIS — K5090 Crohn's disease, unspecified, without complications: Secondary | ICD-10-CM | POA: Diagnosis not present

## 2019-06-15 HISTORY — PX: COLONOSCOPY WITH PROPOFOL: SHX5780

## 2019-06-15 SURGERY — COLONOSCOPY WITH PROPOFOL
Anesthesia: General

## 2019-06-15 MED ORDER — FENTANYL CITRATE (PF) 100 MCG/2ML IJ SOLN: 25.0000 ug | INTRAMUSCULAR | Status: DC | PRN

## 2019-06-15 MED ORDER — FENTANYL CITRATE (PF) 100 MCG/2ML IJ SOLN
INTRAMUSCULAR | Status: DC | PRN
  Administered 2019-06-15 (×2): 50 ug via INTRAVENOUS

## 2019-06-15 MED ORDER — PROPOFOL 10 MG/ML IV BOLUS
INTRAVENOUS | Status: DC | PRN
  Administered 2019-06-15: 100 mg via INTRAVENOUS

## 2019-06-15 MED ORDER — MIDAZOLAM HCL 2 MG/2ML IJ SOLN
INTRAMUSCULAR | Status: AC
  Filled 2019-06-15: qty 2

## 2019-06-15 MED ORDER — LIDOCAINE 2% (20 MG/ML) 5 ML SYRINGE
INTRAMUSCULAR | Status: DC | PRN
  Administered 2019-06-15: 25 mg via INTRAVENOUS

## 2019-06-15 MED ORDER — METRONIDAZOLE 500 MG PO TABS: 500.0000 mg | ORAL_TABLET | Freq: Two times a day (BID) | ORAL | 2 refills | Status: AC

## 2019-06-15 MED ORDER — PROPOFOL 500 MG/50ML IV EMUL
INTRAVENOUS | Status: DC | PRN
  Administered 2019-06-15: 120 ug/kg/min via INTRAVENOUS

## 2019-06-15 MED ORDER — SODIUM CHLORIDE 0.9 % IV SOLN
INTRAVENOUS | Status: DC
  Administered 2019-06-15: 10:00:00 via INTRAVENOUS

## 2019-06-15 MED ORDER — ONDANSETRON HCL 4 MG/2ML IJ SOLN: 4.0000 mg | Freq: Once | INTRAMUSCULAR | Status: DC | PRN

## 2019-06-15 MED ORDER — FENTANYL CITRATE (PF) 100 MCG/2ML IJ SOLN
INTRAMUSCULAR | Status: AC
  Filled 2019-06-15: qty 2

## 2019-06-15 MED ORDER — CIPROFLOXACIN HCL 500 MG PO TABS: 500.0000 mg | ORAL_TABLET | Freq: Two times a day (BID) | ORAL | 2 refills | Status: AC

## 2019-06-15 MED ORDER — MIDAZOLAM HCL 5 MG/5ML IJ SOLN
INTRAMUSCULAR | Status: DC | PRN
  Administered 2019-06-15: 2 mg via INTRAVENOUS

## 2019-06-15 NOTE — Anesthesia Postprocedure Evaluation (Signed)
Anesthesia Post Note  Patient: Gregory Meyer  Procedure(s) Performed: COLONOSCOPY WITH PROPOFOL (N/A )  Patient location during evaluation: Endoscopy Anesthesia Type: General Level of consciousness: awake and alert and oriented Pain management: pain level controlled Vital Signs Assessment: post-procedure vital signs reviewed and stable Respiratory status: spontaneous breathing Cardiovascular status: blood pressure returned to baseline Anesthetic complications: no     Last Vitals:  Vitals:   06/15/19 1050 06/15/19 1100  BP: 108/74 115/87  Pulse: 70 63  Resp: 12 15  Temp:    SpO2: 100% 100%    Last Pain:  Vitals:   06/15/19 1040  TempSrc: Tympanic                 [name]

## 2019-06-15 NOTE — Anesthesia Post-op Follow-up Note (Signed)
Anesthesia QCDR form completed.        

## 2019-06-15 NOTE — Op Note (Signed)
[facility] Gastroenterology Patient Name: Gregory Meyer Procedure Date: 06/15/2019 10:08 AM MRN: [id number] Account #: [id number] Date of Birth: 03/20/1981 Admit Type: Outpatient Age: 38 Room: [facility] ENDO ROOM 1 Gender: Male Note Status: Finalized Procedure:            Colonoscopy Indications:          Disease activity assessment of Crohn's disease of the                        colon Providers:            Lin Landsman MD, MD Referring MD:         Minette Brine (Referring MD) Medicines:            Monitored Anesthesia Care Complications:        No immediate complications. Estimated blood loss: None. Procedure:            Pre-Anesthesia Assessment:                       - Prior to the procedure, a History and Physical was                        performed, and patient medications and allergies were                        reviewed. The patient is competent. The risks and                        benefits of the procedure and the sedation options and                        risks were discussed with the patient. All questions                        were answered and informed consent was obtained.                        Patient identification and proposed procedure were                        verified by the physician, the nurse, the                        anesthesiologist, the anesthetist and the technician in                        the pre-procedure area in the procedure room in the                        endoscopy suite. Mental Status Examination: alert and                        oriented. Airway Examination: normal oropharyngeal                        airway and neck mobility. Respiratory Examination:                        clear to auscultation. CV Examination: normal.  Prophylactic Antibiotics: The patient does not require                        prophylactic antibiotics. Prior Anticoagulants: The                        patient has taken  no previous anticoagulant or                        antiplatelet agents. ASA Grade Assessment: II - A                        patient with mild systemic disease. After reviewing the                        risks and benefits, the patient was deemed in                        satisfactory condition to undergo the procedure. The                        anesthesia plan was to use monitored anesthesia care                        (MAC). Immediately prior to administration of                        medications, the patient was re-assessed for adequacy                        to receive sedatives. The heart rate, respiratory rate,                        oxygen saturations, blood pressure, adequacy of                        pulmonary ventilation, and response to care were                        monitored throughout the procedure. The physical status                        of the patient was re-assessed after the procedure.                       After obtaining informed consent, the colonoscope was                        passed under direct vision. Throughout the procedure,                        the patient's blood pressure, pulse, and oxygen                        saturations were monitored continuously. The                        Colonoscope was introduced through the anus and  advanced to the 10 cm into the ileum. The colonoscopy                        was somewhat difficult due to significant looping.                        Successful completion of the procedure was aided by                        applying abdominal pressure. The patient tolerated the                        procedure well. The quality of the bowel preparation                        was adequate. Findings:      Cluster of inflammed skin tags and hemorrhoids were found on perianal       exam.      The terminal ileum appeared normal. Biopsies were taken with a cold       forceps for histology.      The Simple  Endoscopic Score for Crohn's Disease was determined based on       the endoscopic appearance of the mucosa in the following segments:      - Ileum: Findings include no ulcers present, no ulcerated surfaces, no       affected surfaces and no narrowings.      - Right Colon: Findings include aphthous ulcers less than 0.5 cm in       size, less than 10% ulcerated surfaces, less than 50% of surfaces       affected and no narrowings. Segment score: 3.      - Transverse Colon: Findings include aphthous ulcers less than 0.5 cm in       size, less than 10% ulcerated surfaces, less than 50% of surfaces       affected and no narrowings. Segment score: 3.      - Left Colon: Findings include aphthous ulcers less than 0.5 cm in size,       less than 10% ulcerated surfaces, less than 50% of surfaces affected and       no narrowings. Segment score: 3.      - Rectum: Findings include large ulcers 0.5-2 cm in size, greater than       30% ulcerated surfaces and 50-75% of surfaces affected. Segment score:       7. Biopsies were taken with a cold forceps for histology.      Inflammation characterized by aphthous ulcerations and shallow       ulcerations was found as patches surrounded by normal mucosa in the       distal rectum (most severe), mild in descending colon, in the transverse       colon, in the ascending colon and at the cecum. The sigmoid colon was       spared. This was severe, and when compared to previous examinations, the       findings are worsened. Impression:           - Hemorrhoids found on perianal exam.                       - The examined portion of the ileum was normal.  Biopsied.                       - Simple Endoscopic Score for Crohn's Disease:, mucosal                        inflammatory changes secondary to Crohn's disease with                        colonic involvement. Biopsied.                       - Crohn's disease with colonic involvement.                         Inflammation was found in the distal rectum most                        severe, mild in descending colon, in the transverse                        colon, in the ascending colon and at the cecum. Recommendation:       - Discharge patient to home (with escort).                       - Resume regular diet today.                       - Continue present medications.                       - Recommend to start biologic pending TB test                       - Await pathology results.                       - Return to my office as previously scheduled.                       - Refer to a colo-rectal surgeon at appointment to be                        scheduled to evaluate for seton placement. Procedure Code(s):    --- Professional ---                       [phone], Colonoscopy, flexible; with biopsy, single or                        multiple Diagnosis Code(s):    --- Professional ---                       K64.9, Unspecified hemorrhoids                       K50.10, Crohn's disease of large intestine without                        complications CPT copyright 2019 [employer] Association. All rights reserved. The codes documented in this report are preliminary and upon coder review may  be revised to meet current compliance  requirements. Dr. Ulyess Mort Lin Landsman MD, MD 06/15/2019 10:53:10 AM This report has been signed electronically. Number of Addenda: 0 Note Initiated On: 06/15/2019 10:08 AM Scope Withdrawal Time: 0 hours 18 minutes 7 seconds  Total Procedure Duration: 0 hours 25 minutes 17 seconds  Estimated Blood Loss: Estimated blood loss: none.      [facility]

## 2019-06-15 NOTE — H&P (Signed)
Cephas Darby, MD [address]  [place]  [place], [place] [address]  Main: [phone]  Fax: [phone] Pager: [phone]  Primary Care Physician:  Minette Brine, FNP Primary Gastroenterologist:  Dr. Cephas Darby  Pre-Procedure History & Physical: HPI:  Gregory Meyer is a 38 y.o. male is here for an colonoscopy.   Past Medical History:  Diagnosis Date  . Crohn's disease ([place])   . Inguinal hernia 01/2014   left  . Seasonal allergies     Past Surgical History:  Procedure Laterality Date  . COLONOSCOPY     x 2-3  . HERNIA REPAIR Right 2010  . INGUINAL HERNIA REPAIR Left 02/28/2014   Procedure: HERNIA REPAIR INGUINAL ADULT;  Surgeon: Adin Hector, MD;  Location: [facility];  Service: General;  Laterality: Left;  . INSERTION OF MESH Left 02/28/2014   Procedure: INSERTION OF MESH;  Surgeon: Adin Hector, MD;  Location: [facility];  Service: General;  Laterality: Left;    Prior to Admission medications   Medication Sig Start Date End Date Taking? Authorizing Provider  acetaminophen (TYLENOL) 500 MG tablet Take 1,000 mg by mouth every 6 (six) hours as needed (pain).    Yes [provider]  ciprofloxacin (CIPRO) 500 MG tablet Take 1 tablet (500 mg total) by mouth 2 (two) times daily for 14 days. 06/07/19 06/21/19 Yes , Tally Due, MD  hydrocortisone (ANUSOL-HC) 2.5 % rectal cream Place rectally 3 (three) times daily for 14 days. 06/07/19 06/21/19 Yes , Tally Due, MD  metroNIDAZOLE (FLAGYL) 500 MG tablet Take 500 mg by mouth 3 (three) times daily.   Yes [provider]  predniSONE (DELTASONE) 10 MG tablet Take 4 tablets (40 mg total) by mouth daily with breakfast. 06/07/19 07/07/19 Yes , Tally Due, MD    Allergies as of 06/07/2019  . (No Known Allergies)    Family History  Problem Relation Age of Onset  . Cancer Mother   . Hypertension Mother   . Diabetes Father     Social History    Socioeconomic History  . Marital status: Married    Spouse name: Not on file  . Number of children: Not on file  . Years of education: Not on file  . Highest education level: Not on file  Occupational History  . Not on file  Social Needs  . Financial resource strain: Not on file  . Food insecurity    Worry: Not on file    Inability: Not on file  . Transportation needs    Medical: Not on file    Non-medical: Not on file  Tobacco Use  . Smoking status: Never Smoker  . Smokeless tobacco: Never Used  Substance and Sexual Activity  . Alcohol use: Yes    Comment: occasionally   . Drug use: Yes    Types: Marijuana    Comment: 05/09/19  . Sexual activity: Yes  Lifestyle  . Physical activity    Days per week: Not on file    Minutes per session: Not on file  . Stress: Not on file  Relationships  . Social Herbalist on phone: Not on file    Gets together: Not on file    Attends religious service: Not on file    Active member of club or organization: Not on file    Attends meetings of clubs or organizations: Not on file    Relationship status: Not on file  . Intimate  partner violence    Fear of current or ex partner: Not on file    Emotionally abused: Not on file    Physically abused: Not on file    Forced sexual activity: Not on file  Other Topics Concern  . Not on file  Social History Narrative  . Not on file    Review of Systems: See HPI, otherwise negative ROS  Physical Exam: BP 125/88   Pulse 75   Temp 97.8 F (36.6 C) (Tympanic)   Resp 16   Ht 6' (1.829 m)   Wt 68 kg   SpO2 100%   BMI 20.34 kg/m  General:   Alert,  pleasant and cooperative in NAD Head:  Normocephalic and atraumatic. Neck:  Supple; no masses or thyromegaly. Lungs:  Clear throughout to auscultation.    Heart:  Regular rate and rhythm. Abdomen:  Soft, nontender and nondistended. Normal bowel sounds, without guarding, and without rebound.   Neurologic:  Alert and  oriented x4;   grossly normal neurologically.  Impression/Plan: Gregory Meyer is here for an colonoscopy to be performed for h/o crohn's   Risks, benefits, limitations, and alternatives regarding  colonoscopy have been reviewed with the patient.  Questions have been answered.  All parties agreeable.   Sherri Sear, MD  06/15/2019, 10:00 AM

## 2019-06-15 NOTE — Anesthesia Preprocedure Evaluation (Signed)
Anesthesia Evaluation  Patient identified by MRN, date of birth, ID band Patient awake    Reviewed: Allergy & Precautions, H&P , NPO status , Patient's Chart, lab work & pertinent test results  Airway Mallampati: I  TM Distance: >3 FB Neck ROM: Full    Dental  (+) Teeth Intact, Dental Advisory Given   Pulmonary neg pulmonary ROS,    breath sounds clear to auscultation       Cardiovascular negative cardio ROS   Rhythm:Regular Rate:Normal     Neuro/Psych negative neurological ROS  negative psych ROS   GI/Hepatic Neg liver ROS,   Endo/Other  negative endocrine ROS  Renal/GU negative Renal ROS  negative genitourinary   Musculoskeletal negative musculoskeletal ROS (+)   Abdominal   Peds negative pediatric ROS (+)  Hematology negative hematology ROS (+)   Anesthesia Other Findings Past Medical History: No date: Crohn's disease ([facility]) 01/2014: Inguinal hernia     Comment:  left No date: Seasonal allergies  Reproductive/Obstetrics                             Anesthesia Physical  Anesthesia Plan  ASA: II  Anesthesia Plan: General   Post-op Pain Management:    Induction: Intravenous  PONV Risk Score and Plan:   Airway Management Planned: Nasal Cannula  Additional Equipment:   Intra-op Plan:   Post-operative Plan:   Informed Consent: I have reviewed the patients History and Physical, chart, labs and discussed the procedure including the risks, benefits and alternatives for the proposed anesthesia with the patient or authorized representative who has indicated his/her understanding and acceptance.     Dental advisory given  Plan Discussed with: CRNA, Anesthesiologist and Surgeon  Anesthesia Plan Comments:         Anesthesia Quick Evaluation

## 2019-06-15 NOTE — Telephone Encounter (Signed)
Spoke with pt and provided clarification for procedure findings

## 2019-06-15 NOTE — Transfer of Care (Signed)
Immediate Anesthesia Transfer of Care Note  Patient: Gregory Meyer  Procedure(s) Performed: COLONOSCOPY WITH PROPOFOL (N/A )  Patient Location: Endoscopy Unit  Anesthesia Type:General  Level of Consciousness: awake  Airway & Oxygen Therapy: Patient Spontanous Breathing  Post-op Assessment: Report given to RN and Post -op Vital signs reviewed and stable  Post vital signs: Reviewed  Last Vitals:  Vitals Value Taken Time  BP 100/63 06/15/19 1047  Temp 36.1 C 06/15/19 1047  Pulse 68 06/15/19 1047  Resp 10 06/15/19 1047  SpO2 99 % 06/15/19 1047    Last Pain:  Vitals:   06/15/19 1040  TempSrc: Tympanic         Complications: No apparent anesthesia complications

## 2019-06-15 NOTE — Telephone Encounter (Signed)
Pt has been notified.

## 2019-06-15 NOTE — Telephone Encounter (Signed)
Pt wife left vm she states pt had a procedure today and she wanted to clarify on the findings for today's procedure she states she does have permission to discuss please call her or pt to calrify

## 2019-06-16 ENCOUNTER — Encounter: Payer: Self-pay | Admitting: Gastroenterology

## 2019-06-16 LAB — SURGICAL PATHOLOGY

## 2019-06-18 LAB — QUANTIFERON-TB GOLD PLUS: QuantiFERON-TB Gold Plus: NEGATIVE

## 2019-06-18 LAB — QUANTIFERON-TB GOLD PLUS (RQFGPL)
QuantiFERON Mitogen Value: 5.84 IU/mL
QuantiFERON Nil Value: 0.01 IU/mL
QuantiFERON TB1 Ag Value: 0.02 IU/mL
QuantiFERON TB2 Ag Value: 0.01 IU/mL

## 2019-06-20 ENCOUNTER — Telehealth: Payer: Self-pay | Admitting: Gastroenterology

## 2019-06-20 LAB — THIOPURINE METHYLTRANSFERASE (TPMT), RBC: TPMT Activity:: 17.1 Units/mL RBC

## 2019-06-20 NOTE — Telephone Encounter (Signed)
Discussed with patient about the colonoscopy results including the rectal biopsies which revealed low-grade dysplasia with tubular changes in the background of severe inflammation. Therefore, I recommend referral to [facility] for second opinion for Remicade or diverting colostomy.  He is agreeable.  Urgent referral to [facility] center placed  Cephas Darby, MD [address]  [place]  [place], [place] [address]  Main: [phone]  Fax: [phone] Pager: [phone]

## 2019-06-22 LAB — INFLIXIMAB (IFX) CONC+ IFX AB
Anti-Infliximab Antibody: <22 ng/mL
Infliximab Drug Level: <0.4 ug/mL

## 2019-06-22 LAB — CALPROTECTIN, FECAL: Calprotectin, Fecal: 584 ug/g — ABNORMAL HIGH (ref 0–120)

## 2019-06-27 ENCOUNTER — Other Ambulatory Visit: Payer: Self-pay | Admitting: Gastroenterology

## 2019-06-27 DIAGNOSIS — Z6820 Body mass index (BMI) 20.0-20.9, adult: Secondary | ICD-10-CM | POA: Diagnosis not present

## 2019-06-27 DIAGNOSIS — K50113 Crohn's disease of large intestine with fistula: Secondary | ICD-10-CM | POA: Diagnosis not present

## 2019-06-27 MED ORDER — AZATHIOPRINE 100 MG PO TABS: 150.0000 mg | ORAL_TABLET | Freq: Every day | ORAL | 2 refills | Status: AC

## 2019-06-27 NOTE — Progress Notes (Signed)
Patient was seen by Dr. Burke Keels at [facility]. Per his recommendations, will start azathioprine 150 mg daily, check CBC and LFTs in 1 week (TPMT level is low normal), then periodically at regular intervals Continue Cipro and Flagyl 100 mg twice daily Taper prednisone Patient is scheduled to receive Remicade at his home Thursday next week  Cephas Darby, MD [address]  [place]  [place], [place] [address]  Main: [phone]  Fax: [phone] Pager: [phone]

## 2019-06-28 ENCOUNTER — Other Ambulatory Visit: Payer: Self-pay

## 2019-06-28 DIAGNOSIS — K50113 Crohn's disease of large intestine with fistula: Secondary | ICD-10-CM

## 2019-06-29 ENCOUNTER — Ambulatory Visit: Payer: BC Managed Care – PPO | Admitting: Nurse Practitioner

## 2019-06-29 ENCOUNTER — Encounter: Payer: BC Managed Care – PPO | Admitting: Internal Medicine

## 2019-06-29 ENCOUNTER — Other Ambulatory Visit: Payer: Self-pay

## 2019-06-29 VITALS — BP 120/78 | HR 72 | Temp 98.7°F | Ht 72.0 in | Wt 146.8 lb

## 2019-06-29 DIAGNOSIS — Z23 Encounter for immunization: Secondary | ICD-10-CM | POA: Diagnosis not present

## 2019-07-04 ENCOUNTER — Other Ambulatory Visit: Payer: Self-pay

## 2019-07-05 ENCOUNTER — Ambulatory Visit (INDEPENDENT_AMBULATORY_CARE_PROVIDER_SITE_OTHER): Payer: BC Managed Care – PPO | Admitting: Nurse Practitioner

## 2019-07-05 ENCOUNTER — Encounter: Payer: Self-pay | Admitting: Nurse Practitioner

## 2019-07-05 VITALS — BP 112/60 | HR 92 | Temp 98.7°F | Ht 71.0 in | Wt 143.2 lb

## 2019-07-05 DIAGNOSIS — K5010 Crohn's disease of large intestine without complications: Secondary | ICD-10-CM

## 2019-07-05 NOTE — Progress Notes (Signed)
Subjective:     Patient ID: Gregory Meyer , male    DOB: Mar 25, 1981 , 38 y.o.   MRN: [id number]   Chief Complaint  Patient presents with  . Crohn's diease    patient presents today for a f/u    HPI  Here for follow up he is on Cipro, Flagyl and prednisone. Azithromycin. He is to start back on the Remicaid.  He had a colonoscopy found some precancerous cells.  Went to specialist in [facility], will see how remicaid and have a repeat colonoscopy with dye if still has precancerous cells.  Will consider Humira if not effective may need a colostomy.     Past Medical History:  Diagnosis Date  . Crohn's disease ([facility] [place])   . Inguinal hernia 01/2014   left  . Seasonal allergies      Family History  Problem Relation Age of Onset  . Cancer Mother   . Hypertension Mother   . Diabetes Father      Current Outpatient Medications:  .  azathioprine (IMURAN) 100 MG tablet, Take 1.5 tablets (150 mg total) by mouth daily., Disp: 45 tablet, Rfl: 2 .  metroNIDAZOLE (FLAGYL) 500 MG tablet, Take 1 tablet (500 mg total) by mouth 2 (two) times daily., Disp: 60 tablet, Rfl: 2 .  predniSONE (DELTASONE) 10 MG tablet, Take 4 tablets (40 mg total) by mouth daily with breakfast., Disp: 120 tablet, Rfl: 0 .  acetaminophen (TYLENOL) 500 MG tablet, Take 1,000 mg by mouth every 6 (six) hours as needed (pain). , Disp: , Rfl:  .  ciprofloxacin (CIPRO) 500 MG tablet, Take 1 tablet (500 mg total) by mouth 2 (two) times daily. (Patient not taking: Reported on 07/05/2019), Disp: 60 tablet, Rfl: 2   No Known Allergies   Review of Systems  Constitutional: Negative.   Respiratory: Negative.   Cardiovascular: Negative.   Neurological: Negative for dizziness and headaches.  Psychiatric/Behavioral: Negative.      Today's Vitals   07/05/19 1148  BP: 112/60  Pulse: 92  Temp: 98.7 F (37.1 C)  TempSrc: Oral  Weight: 143 lb 3.2 oz (65 kg)  Height: 5' 11"  (1.803 m)  PainSc: 2   PainLoc: Rectum   Body mass  index is 19.97 kg/m.   Objective:  Physical Exam Constitutional:      Appearance: Normal appearance.  Cardiovascular:     Rate and Rhythm: Normal rate and regular rhythm.     Pulses: Normal pulses.     Heart sounds: Normal heart sounds. No murmur.  Pulmonary:     Effort: Pulmonary effort is normal.     Breath sounds: Normal breath sounds.  Skin:    General: Skin is warm.     Capillary Refill: Capillary refill takes less than 2 seconds.  Neurological:     General: No focal deficit present.     Mental Status: He is alert and oriented to person, place, and time.  Psychiatric:        Mood and Affect: Mood normal.        Behavior: Behavior normal.        Thought Content: Thought content normal.        Judgment: Judgment normal.         Assessment And Plan:     1. Crohn's disease of colon without complication [facility])  Being followed by Dr. Marius Ditch  Discussed healthy food options with high iron  Will check his vitamin d due to the chron's disease  He is  to schedule a physical by the end of the year to ensure his health maintenance needs are in place.   Minette Brine, FNP    THE PATIENT IS ENCOURAGED TO PRACTICE SOCIAL DISTANCING DUE TO THE COVID-19 PANDEMIC.

## 2019-07-06 ENCOUNTER — Other Ambulatory Visit: Payer: Self-pay | Admitting: Nurse Practitioner

## 2019-07-06 ENCOUNTER — Encounter: Payer: Self-pay | Admitting: Gastroenterology

## 2019-07-06 ENCOUNTER — Ambulatory Visit: Payer: Self-pay | Admitting: Gastroenterology

## 2019-07-06 DIAGNOSIS — K50111 Crohn's disease of large intestine with rectal bleeding: Secondary | ICD-10-CM | POA: Diagnosis not present

## 2019-07-06 LAB — VITAMIN D 25 HYDROXY (VIT D DEFICIENCY, FRACTURES): Vit D, 25-Hydroxy: 29.1 ng/mL — ABNORMAL LOW (ref 30.0–100.0)

## 2019-07-06 MED ORDER — VITAMIN D (ERGOCALCIFEROL) 1.25 MG (50000 UNIT) PO CAPS: 50000.0000 [IU] | ORAL_CAPSULE | ORAL | 0 refills | Status: DC

## 2019-07-13 ENCOUNTER — Telehealth: Payer: Self-pay | Admitting: Gastroenterology

## 2019-07-13 ENCOUNTER — Ambulatory Visit: Payer: BC Managed Care – PPO | Admitting: Gastroenterology

## 2019-07-13 NOTE — Telephone Encounter (Signed)
Called patient to remind him about CBC and LFTs as he is on azathioprine.  He reports that his symptoms have been stable.  He tolerated the first infusion well.  He was also informed by his pharmacy that his insurance has approved Cipro for only 28 pills/month Patient said he will go to [facility] near to his home tomorrow  Cephas Darby, MD [address]  [place]  [place], [place] [address]  Main: [phone]  Fax: [phone] Pager: [phone]

## 2019-07-14 DIAGNOSIS — K50113 Crohn's disease of large intestine with fistula: Secondary | ICD-10-CM | POA: Diagnosis not present

## 2019-07-15 LAB — HEPATIC FUNCTION PANEL
ALT: 17 IU/L (ref 0–44)
AST: 12 IU/L (ref 0–40)
Albumin: 4 g/dL (ref 4.0–5.0)
Alkaline Phosphatase: 67 IU/L (ref 39–117)
Bilirubin Total: <0.2 mg/dL (ref 0.0–1.2)
Bilirubin, Direct: 0.05 mg/dL (ref 0.00–0.40)
Total Protein: 7 g/dL (ref 6.0–8.5)

## 2019-07-15 LAB — CBC
Hematocrit: 35.1 % — ABNORMAL LOW (ref 37.5–51.0)
Hemoglobin: 11.5 g/dL — ABNORMAL LOW (ref 13.0–17.7)
MCH: 28.1 pg (ref 26.6–33.0)
MCHC: 32.8 g/dL (ref 31.5–35.7)
MCV: 86 fL (ref 79–97)
Platelets: 412 10*3/uL (ref 150–450)
RBC: 4.09 x10E6/uL — ABNORMAL LOW (ref 4.14–5.80)
RDW: 16.6 % — ABNORMAL HIGH (ref 11.6–15.4)
WBC: 6.5 10*3/uL (ref 3.4–10.8)

## 2019-07-21 DIAGNOSIS — K50111 Crohn's disease of large intestine with rectal bleeding: Secondary | ICD-10-CM | POA: Diagnosis not present

## 2019-08-06 NOTE — Progress Notes (Signed)
He was a no show

## 2019-08-09 ENCOUNTER — Other Ambulatory Visit: Payer: Self-pay

## 2019-08-14 ENCOUNTER — Encounter: Payer: Self-pay | Admitting: Gastroenterology

## 2019-08-14 ENCOUNTER — Ambulatory Visit: Payer: BC Managed Care – PPO | Admitting: Gastroenterology

## 2019-08-14 ENCOUNTER — Other Ambulatory Visit: Payer: Self-pay

## 2019-08-14 VITALS — BP 125/66 | HR 73 | Temp 98.8°F | Resp 17 | Ht 71.0 in | Wt 162.8 lb

## 2019-08-14 DIAGNOSIS — Z23 Encounter for immunization: Secondary | ICD-10-CM

## 2019-08-14 DIAGNOSIS — K50113 Crohn's disease of large intestine with fistula: Secondary | ICD-10-CM

## 2019-08-14 DIAGNOSIS — K50111 Crohn's disease of large intestine with rectal bleeding: Secondary | ICD-10-CM

## 2019-08-14 DIAGNOSIS — K50919 Crohn's disease, unspecified, with unspecified complications: Secondary | ICD-10-CM

## 2019-08-14 NOTE — Progress Notes (Signed)
Cephas Darby, MD [address]  [place]  [place], [place] [address]  Main: [phone]  Fax: [phone]    Gastroenterology Consultation  Referring Provider:     Minette Brine, [place] Primary Care Physician:  Minette Brine, FNP Primary Gastroenterologist:  Dr. Cephas Darby Reason for Consultation:     Crohn's disease of the colon, perianal Crohn's        HPI:   VIRGIL LIGHTNER is a 38 y.o. male referred by Dr. Minette Brine, [place]  for consultation & management of Crohn's disease of the colon, perianal Crohn's  Follow-up visit 08/14/2019 Patient received 2 induction doses of Remicade so far.  Due for third induction dose Friday this week.  Patient reports tolerating the medication very well without any side effects.  He is currently taking ciprofloxacin once a day, metronidazole twice daily.  Decrease prednisone to 10 mg daily for last 2 weeks.  He continues to take azathioprine 150 mg daily.  With regards to his symptoms, he reports feeling 50% better.  Less urgency, reports having bowel movements about 3 times in the morning, 2 times in the evening.  Appetite is intact, able to eat good 3 meals a day, energy levels are better.  Denies any blood in the stools, no evidence of rectal pain, fistula drainage.  He gained about 12 pounds in last 2 months.  Crohn's disease classification:  Age: 73 to 88 Location:colonic  Behavior: non stricturing, penetrating  Perianal: Yes  IBD diagnosis: Patient is diagnosed with Crohn's disease when he was 38 years old, his symptoms were rectal bleeding, rectal pain, bloody diarrhea, rectal discharge, weight loss  Disease course: Right after diagnosis, patient was started on Remicade monotherapy.  Due to his insurance issues, he has been on and off Remicade to date.  The longest duration of Remicade was for 2 years without interruption.  His last Remicade dose was about 1 year ago.  He was receiving every 12 weeks when he was initially  diagnosed and moved every 8 weeks.  He reports having breakthrough symptoms with 8 weeks interval on Remicade.  Patient was seeing Dr. Alessandra Bevels with [facility] gastroenterology as well as Dr. Collene Mares in [place].  He lost insurance, therefore lost to follow-up.  Currently, he has insurance to his wife and wanted to see an IBD specialist in [place].  Patient reports that he was partially responding to Remicade.  He was receiving Flagyl as needed for rectal discharge which helps.  He does have severe rectal discomfort from external hemorrhoids which bleed from time to time.  His perianal skin is always sore and sometimes have to wear depends when discharge is worse.  He also has leakage of the stool.  He has been limiting his p.o. intake due to rectal urgency, diarrhea.  He has 4-5 bowel movements daily.  He lost about 20 pounds within last 1 year.  He does have normocytic anemia.  Patient is started on Flagyl by his PCP.  05/2019:  continued antibiotics with Cipro and metronidazole for perianal Crohn's.  MRI pelvis confirmed perianal fistula with no underlying abscess.  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  Patient is evaluated by Dr. Vincenza Hews at [facility] IBD center due to evidence of dysplasia in the rectum recommended to maximize medical therapy.  After the basic  work-up, patient is started on high-dose Remicade 10 mg/kg body weight, azathioprine 150 mg daily, short course of prednisone, antibiotics.  Extra intestinal manifestations: None  IBD surgical history: None  Imaging:  MRE none MRI pelvis 06/08/2019 IMPRESSION: 1. Transsphincteric left perianal fistula arising at the 2 o'clock position and tracking deep to the skin along the left intergluteal fold. Signal void  within the track as it extends to the skin may be related to gas within the track or the presence of a seton. 2. No rim enhancing, drainable perianal abscess.  CTE in 2011, normal SBFT none  Procedures: Colonoscopy 12/24/2016 at [facility] gastroenterology by Dr. 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 - The examined portion of the ileum was normal. Biopsied. - Simple Endoscopic Score for Crohn's Disease 16:, mucosal inflammatory changes secondary to Crohn's disease with colonic involvement. Biopsied. - Crohn's disease with colonic involvement. Inflammation was found in the distal rectum most severe, mild in descending colon, in the transverse colon, in the ascending colon and at the cecum.  DIAGNOSIS:  A. TERMINAL ILEUM; COLD BIOPSY:  - BENIGN ENTERIC MUCOSA WITH NORMAL VILLOUS ARCHITECTURE AND REACTIVE  FOLLICULAR LYMPHOID HYPERPLASIA.  - NEGATIVE FOR FEATURES OF ACTIVE ENTERITIS/ILEITIS.  - NEGATIVE FOR GRANULOMA, DYSPLASIA, AND MALIGNANCY.   B. COLON, RIGHT; COLD BIOPSY:  - CHRONIC COLITIS WITH MILD ACTIVITY (CRYPTITIS).  - SMALL SUBMUCOSAL NONCASEATING GRANULOMA.  - NEGATIVE FOR DYSPLASIA AND MALIGNANCY.   C. COLON, LEFT; COLD BIOPSY:  - CHRONIC COLITIS WITHOUT HISTOLOGIC EVIDENCE OF ACTIVE MUCOSAL  INFLAMMATION.  - SMALL SUBMUCOSAL NONCASEATING GRANULOMA.  - NEGATIVE FOR DYSPLASIA AND  MALIGNANCY.   D. RECTUM; COLD BIOPSY:  - CHRONIC COLITIS WITH SEVERE ACTIVITY (ULCERATION AND CRYPT ABSCESS).  - POLYPOID LOW GRADE DYSPLASIA WITH TUBULAR ARCHITECTURE.  - NEGATIVE FOR GRANULOMA, DYSPLASIA, AND MALIGNANCY.   Upper Endoscopy none  VCE none  IBD medications:  Steroids: Prednisone responsive, tolerating well 5-ASA: None  immunomodulators: AZA started in 06/2019, methotrexate, nave TPMT status homozygous Biologics: Anti TNFs: Remicade monotherapy, started at age 23, previously every 12 weeks then moved to every 8 weeks And infliximab antibodies undetected on 06/09/2019 Restarted Remicade, first induction dose was 5 mg/kg, increase to 10 mg/kg from second induction dose Anti Integrins: Nave Ustekinumab: Nave Tofactinib: Nave Clinical trial: None  NSAIDs: None  Antiplts/Anticoagulants/Anti thrombotics: None  His cousin has Crohn's disease Denies family history of GI malignancy  Past Medical History:  Diagnosis Date   Crohn's disease ([place])    Inguinal hernia 01/2014   left   Seasonal allergies     Past Surgical History:  Procedure Laterality Date   COLONOSCOPY     x 2-3   COLONOSCOPY WITH PROPOFOL N/A 06/15/2019   Procedure: COLONOSCOPY WITH PROPOFOL;  Surgeon: Lin Landsman, MD;  Location: [facility] ENDOSCOPY;  Service: Gastroenterology;  Laterality: N/A;   HERNIA REPAIR Right 2010   INGUINAL HERNIA REPAIR Left 02/28/2014   Procedure: HERNIA REPAIR INGUINAL ADULT;  Surgeon: Adin Hector, MD;  Location: [facility];  Service: General;  Laterality: Left;   INSERTION OF MESH Left 02/28/2014   Procedure: INSERTION OF MESH;  Surgeon: Adin Hector, MD;  Location: [facility];  Service: General;  Laterality: Left;    Current Outpatient Medications:    azaTHIOprine (IMURAN) 50 MG tablet, Take 150 mg by mouth daily. , Disp: , Rfl:    ciprofloxacin (CIPRO) 500 MG tablet, Take by mouth., Disp: , Rfl:    metroNIDAZOLE  (FLAGYL) 500 MG tablet, Take by mouth., Disp: , Rfl:    predniSONE (DELTASONE) 10 MG tablet, Take by mouth., Disp: , Rfl:    Vitamin D, Ergocalciferol, (DRISDOL) 1.25 MG (50000 UT) CAPS capsule, Take 1 capsule (50,000 Units total) by mouth every 7 (seven) days., Disp: 12 capsule, Rfl: 0   acetaminophen (TYLENOL) 500 MG tablet, Take 1,000 mg by mouth every 6 (six) hours as needed (pain). , Disp: , Rfl:    hydrocortisone (PROCTOZONE-HC) 2.5 % rectal cream, APP REC TID FOR 14 DAYS, Disp: , Rfl:     Family History  Problem Relation Age of Onset   Cancer Mother    Hypertension Mother    Diabetes Father      Social History   Tobacco Use   Smoking status: Never Smoker   Smokeless tobacco: Never Used  Substance Use Topics   Alcohol use: Yes    Comment: occasionally    Drug use: Yes    Types: Marijuana    Comment: 05/09/19    Allergies as of 08/14/2019   (No Known Allergies)    Review of Systems:    All systems reviewed and negative except where noted in HPI.   Physical Exam:  BP 125/66 (BP Location: Left Arm, Patient Position: Sitting, Cuff Size: Normal)    Pulse 73    Temp 98.8 F (37.1 C)    Resp 17    Ht 5' 11"  (1.803 m)    Wt 162 lb 12.8 oz (73.8 kg)    BMI 22.71 kg/m  No LMP for male patient.  General:   Alert, moderately built, moderately nourished, pleasant and cooperative in NAD, in good spirits Head:  Normocephalic and atraumatic. Eyes:  Sclera clear, no icterus.   Conjunctiva pink. Ears:  Normal auditory acuity. Nose:  No deformity, discharge, or lesions. Mouth:  No deformity or lesions,oropharynx pink & moist. Neck:  Supple; no masses or thyromegaly. Lungs:  Respirations even and unlabored.  Clear throughout to auscultation.   No wheezes, crackles, or rhonchi. No acute distress. Heart:  Regular rate and rhythm; no murmurs, clicks, rubs, or gallops. Abdomen:  Normal bowel sounds. Soft, non-tender and non-distended without masses, hepatosplenomegaly or  hernias noted.  No guarding or rebound tenderness.   Rectal: Significant improvement in size of the external hemorrhoids and hemorrhoidal skin tags, nontender, not inflamed Msk:  Symmetrical without gross deformities. Good, equal movement & strength bilaterally. Pulses:  Normal pulses noted. Extremities:  No clubbing or edema.  No cyanosis. Neurologic:  Alert and oriented x3;  grossly normal neurologically. Skin:  Intact without significant lesions or rashes. No jaundice. Psych:  Alert and cooperative. Normal mood and affect.  Imaging Studies: Reviewed  Assessment and Plan:   SEWELL PITNER is a 38 y.o. male with Crohn's colitis, worse in rectum as well as perianal Crohn'n diagnosed at age 82, previously on Remicade monotherapy now with exacerbation of Crohn's secondary to discontinuation of medication.  Colonoscopy confirmed mild colitis, severe proctitis with dysplasia in  the rectum, started on high-dose Remicade, azathioprine, antibiotics and prednisone taper.  Patient is responding well to the above regimen, currently in partial clinical remission, regained weight   Crohn's colitis Continue high-dose Remicade Continue azathioprine 150 mg daily Normal TPMT activity Labs to be done, check CBC, CMP this week, and every 3 months Discontinue prednisone Recommend colonoscopy in December to assess response to therapy, repeat rectal biopsies, performed chromoendoscopy  Perianal fistula Continue metronidazole and Ciprofloxacin  IBD Health Maintenance  1.TB status: Was indeterminate while on prednisone on 06/09/2019, repeat returned negative on 06/15/2019 2. Anemia: Mild normocytic anemia, normal iron, B12 and folate levels 3.Immunizations: Hep A and B not immune, Twinrix first dose on 08/14/2019, Influenza 06/29/2019, prevnar 08/14/2019, pneumovax to be administered in 01/2020, Varicella unknown status, Zoster recommend Shingrix vaccine by PCP 4.Cancer screening I) Colon cancer/dysplasia  surveillance: Low-grade dysplasia in the rectum, recommend repeat colonoscopy with biopsies in 09/2019 II) Cervical cancer: n/a III) Skin cancer - counseled about annual skin exam by dermatology and skin protection in summer using sun screen SPF > 50, clothing 5.Bone health Vitamin D status: 29.1, mildly low, recommend calcium plus vitamin D daily Recheck with next labs Bone density testing: Not done 5. Labs: This week and every 3 months 6. Smoking: Ex-smoker 7. NSAIDs and Antibiotics use: Avoid NSAIDs, on Cipro and Flagyl for perianal fistula    Follow up in 3 to 4 months   Cephas Darby, MD

## 2019-08-15 ENCOUNTER — Other Ambulatory Visit: Payer: Self-pay | Admitting: Nurse Practitioner

## 2019-08-15 DIAGNOSIS — Z23 Encounter for immunization: Secondary | ICD-10-CM

## 2019-08-15 MED ORDER — ZOSTER VAC RECOMB ADJUVANTED 50 MCG/0.5ML IM SUSR: 0.5000 mL | Freq: Once | INTRAMUSCULAR | 0 refills | Status: AC

## 2019-08-17 DIAGNOSIS — K50111 Crohn's disease of large intestine with rectal bleeding: Secondary | ICD-10-CM | POA: Diagnosis not present

## 2019-08-21 ENCOUNTER — Other Ambulatory Visit: Payer: Self-pay | Admitting: Gastroenterology

## 2019-08-21 ENCOUNTER — Other Ambulatory Visit: Payer: Self-pay

## 2019-08-21 ENCOUNTER — Encounter: Payer: Self-pay | Admitting: Gastroenterology

## 2019-08-21 DIAGNOSIS — K50919 Crohn's disease, unspecified, with unspecified complications: Secondary | ICD-10-CM

## 2019-08-21 DIAGNOSIS — K50113 Crohn's disease of large intestine with fistula: Secondary | ICD-10-CM

## 2019-08-21 DIAGNOSIS — K50111 Crohn's disease of large intestine with rectal bleeding: Secondary | ICD-10-CM

## 2019-08-24 DIAGNOSIS — K50113 Crohn's disease of large intestine with fistula: Secondary | ICD-10-CM | POA: Diagnosis not present

## 2019-08-24 DIAGNOSIS — K50111 Crohn's disease of large intestine with rectal bleeding: Secondary | ICD-10-CM | POA: Diagnosis not present

## 2019-08-25 ENCOUNTER — Other Ambulatory Visit: Payer: Self-pay

## 2019-08-25 DIAGNOSIS — K50111 Crohn's disease of large intestine with rectal bleeding: Secondary | ICD-10-CM

## 2019-08-25 LAB — CBC
Hematocrit: 37.5 % (ref 37.5–51.0)
Hemoglobin: 12.6 g/dL — ABNORMAL LOW (ref 13.0–17.7)
MCH: 30 pg (ref 26.6–33.0)
MCHC: 33.6 g/dL (ref 31.5–35.7)
MCV: 89 fL (ref 79–97)
Platelets: 216 10*3/uL (ref 150–450)
RBC: 4.2 x10E6/uL (ref 4.14–5.80)
RDW: 16.7 % — ABNORMAL HIGH (ref 11.6–15.4)
WBC: 2.7 10*3/uL — ABNORMAL LOW (ref 3.4–10.8)

## 2019-08-25 LAB — HEPATIC FUNCTION PANEL
ALT: 20 IU/L (ref 0–44)
AST: 24 IU/L (ref 0–40)
Albumin: 4 g/dL (ref 4.0–5.0)
Alkaline Phosphatase: 61 IU/L (ref 39–117)
Bilirubin Total: 0.4 mg/dL (ref 0.0–1.2)
Bilirubin, Direct: 0.13 mg/dL (ref 0.00–0.40)
Total Protein: 6.8 g/dL (ref 6.0–8.5)

## 2019-08-25 LAB — C-REACTIVE PROTEIN: CRP: <1 mg/L (ref 0–10)

## 2019-09-04 ENCOUNTER — Other Ambulatory Visit: Payer: Self-pay

## 2019-09-04 ENCOUNTER — Ambulatory Visit: Payer: BC Managed Care – PPO | Admitting: Nurse Practitioner

## 2019-09-04 ENCOUNTER — Encounter: Payer: Self-pay | Admitting: Nurse Practitioner

## 2019-09-04 VITALS — BP 112/80 | HR 67 | Temp 98.3°F | Ht 70.8 in | Wt 164.0 lb

## 2019-09-04 DIAGNOSIS — H1032 Unspecified acute conjunctivitis, left eye: Secondary | ICD-10-CM | POA: Diagnosis not present

## 2019-09-04 MED ORDER — DOUBLE ANTIBIOTIC 500-10000 UNIT/GM EX OINT: 1.0000 "application " | TOPICAL_OINTMENT | Freq: Two times a day (BID) | CUTANEOUS | 0 refills | Status: DC

## 2019-09-04 NOTE — Progress Notes (Signed)
Subjective:     Patient ID: Gregory Meyer , male    DOB: 1980-12-30 , 38 y.o.   MRN: [id number]   Chief Complaint  Patient presents with  . eye concerns    patient stated he may have got sanitizer in his eye on thursday night. he stated his eye has been knatted shut when he wakes up in the morning and it is a little itchy    HPI  Left eye irritation - while washing dishes he got sanitizer solution in his eyes while at work.  Then on Friday he woke up and was red and progressively worsened.  He is having small amount of drainage.  Will be a little matted in the morning. Eye is itching at times.  Denies rhinitis.  Denies blurred vision.  Has 1-2/10 discomfort to left eye.     Past Medical History:  Diagnosis Date  . Crohn's disease ([place])   . Inguinal hernia 01/2014   left  . Seasonal allergies      Family History  Problem Relation Age of Onset  . Cancer Mother   . Hypertension Mother   . Diabetes Father      Current Outpatient Medications:  .  azaTHIOprine (IMURAN) 50 MG tablet, Take 150 mg by mouth daily. , Disp: , Rfl:  .  ciprofloxacin (CIPRO) 500 MG tablet, Take by mouth., Disp: , Rfl:  .  metroNIDAZOLE (FLAGYL) 500 MG tablet, Take by mouth., Disp: , Rfl:  .  Vitamin D, Ergocalciferol, (DRISDOL) 1.25 MG (50000 UT) CAPS capsule, Take 1 capsule (50,000 Units total) by mouth every 7 (seven) days., Disp: 12 capsule, Rfl: 0 .  hydrocortisone (PROCTOZONE-HC) 2.5 % rectal cream, APP REC TID FOR 14 DAYS, Disp: , Rfl:  .  predniSONE (DELTASONE) 10 MG tablet, Take by mouth., Disp: , Rfl:    No Known Allergies   Review of Systems  Constitutional: Negative.   HENT: Negative for rhinorrhea.        Left eye itching and redness   Respiratory: Negative.   Cardiovascular: Negative.  Negative for chest pain, palpitations and leg swelling.  Neurological: Negative for dizziness and headaches.  Psychiatric/Behavioral: Negative.      Today's Vitals   09/04/19 0858  BP: 112/80   Pulse: 67  Temp: 98.3 F (36.8 C)  TempSrc: Oral  Weight: 164 lb (74.4 kg)  Height: 5' 10.8" (1.798 m)  PainSc: 2   PainLoc: Eye   Body mass index is 23 kg/m.   Objective:  Physical Exam Constitutional:      General: He is not in acute distress.    Appearance: Normal appearance.  Eyes:     General: No scleral icterus.    Conjunctiva/sclera:     Right eye: Right conjunctiva is not injected. No exudate.    Left eye: Left conjunctiva is injected. No exudate.    Comments: conjuntiva of left eye is erythematous and lids are slightly swollen  Pulmonary:     Effort: Pulmonary effort is normal.  Neurological:     General: No focal deficit present.     Mental Status: He is alert and oriented to person, place, and time.  Psychiatric:        Mood and Affect: Mood normal.        Behavior: Behavior normal.        Thought Content: Thought content normal.        Judgment: Judgment normal.         Assessment And Plan:  1. Acute conjunctivitis of left eye, unspecified acute conjunctivitis type  Likely related to chemical irritation, I have advised him to make his job aware of his symptoms and issues with his eye  Advised if blurred vision or pain to contact office or seek medical treatment at an ophthalmologist. - polymixin-bacitracin (POLYSPORIN) 500-10000 UNIT/GM OINT ointment; Apply 1 application topically 2 (two) times daily.  Dispense: 14 g; Refill: 0   Minette Brine, FNP    THE PATIENT IS ENCOURAGED TO PRACTICE SOCIAL DISTANCING DUE TO THE COVID-19 PANDEMIC.

## 2019-09-05 ENCOUNTER — Other Ambulatory Visit: Payer: Self-pay

## 2019-09-05 DIAGNOSIS — H1032 Unspecified acute conjunctivitis, left eye: Secondary | ICD-10-CM

## 2019-09-05 MED ORDER — DOUBLE ANTIBIOTIC 500-10000 UNIT/GM EX OINT: 1.0000 "application " | TOPICAL_OINTMENT | Freq: Two times a day (BID) | CUTANEOUS | 0 refills | Status: DC

## 2019-09-14 ENCOUNTER — Encounter: Payer: Self-pay | Admitting: Gastroenterology

## 2019-09-15 ENCOUNTER — Other Ambulatory Visit: Payer: Self-pay

## 2019-09-15 DIAGNOSIS — K50111 Crohn's disease of large intestine with rectal bleeding: Secondary | ICD-10-CM

## 2019-09-18 DIAGNOSIS — K50111 Crohn's disease of large intestine with rectal bleeding: Secondary | ICD-10-CM | POA: Diagnosis not present

## 2019-09-19 LAB — CBC
Hematocrit: 38.9 % (ref 37.5–51.0)
Hemoglobin: 13.1 g/dL (ref 13.0–17.7)
MCH: 30.3 pg (ref 26.6–33.0)
MCHC: 33.7 g/dL (ref 31.5–35.7)
MCV: 90 fL (ref 79–97)
Platelets: 231 10*3/uL (ref 150–450)
RBC: 4.33 x10E6/uL (ref 4.14–5.80)
RDW: 14.6 % (ref 11.6–15.4)
WBC: 4.2 10*3/uL (ref 3.4–10.8)

## 2019-10-02 ENCOUNTER — Telehealth: Payer: Self-pay

## 2019-10-02 NOTE — Telephone Encounter (Signed)
Patient is scheduled for Colonoscopy on 10/12/2019 at [facility]. Dr. Marius Ditch is in [place] that day. Called patient to see if we could change him to the [facility] location. Patient asked if he could do it 10/13/2019 instead. Informed patient he could. Informed ENDO

## 2019-10-09 ENCOUNTER — Telehealth: Payer: Self-pay | Admitting: Gastroenterology

## 2019-10-09 NOTE — Telephone Encounter (Signed)
Pt left vm he has a colonoscopy scheduled on Friday and is supposed to go for his Covid 19 test tomorrow he wants to know if he can go on Wednesday Morning please call pt

## 2019-10-10 ENCOUNTER — Other Ambulatory Visit: Admission: RE | Admit: 2019-10-10 | Payer: BC Managed Care – PPO | Source: Ambulatory Visit

## 2019-10-10 NOTE — Telephone Encounter (Signed)
Pt has been advised that he can go for COVID test tomorrow before 10am.  Thanks Constance Hackenberg

## 2019-10-11 ENCOUNTER — Other Ambulatory Visit (HOSPITAL_COMMUNITY)
Admission: RE | Admit: 2019-10-11 | Discharge: 2019-10-11 | Disposition: A | Discharge status: Home / Self Care | Payer: BC Managed Care – PPO | Source: Ambulatory Visit | Attending: Gastroenterology | Admitting: Gastroenterology

## 2019-10-11 ENCOUNTER — Other Ambulatory Visit: Payer: BC Managed Care – PPO

## 2019-10-11 DIAGNOSIS — K50111 Crohn's disease of large intestine with rectal bleeding: Secondary | ICD-10-CM | POA: Diagnosis not present

## 2019-10-11 DIAGNOSIS — Z20828 Contact with and (suspected) exposure to other viral communicable diseases: Secondary | ICD-10-CM | POA: Diagnosis not present

## 2019-10-11 DIAGNOSIS — Z01812 Encounter for preprocedural laboratory examination: Secondary | ICD-10-CM | POA: Insufficient documentation

## 2019-10-11 LAB — SARS CORONAVIRUS 2 (TAT 6-24 HRS): SARS Coronavirus 2: NEGATIVE

## 2019-10-13 ENCOUNTER — Encounter
Admission: RE | Disposition: A | Discharge status: Home / Self Care | Payer: Self-pay | Source: Home / Self Care | Attending: Gastroenterology

## 2019-10-13 ENCOUNTER — Other Ambulatory Visit: Payer: Self-pay

## 2019-10-13 ENCOUNTER — Ambulatory Visit: Payer: BC Managed Care – PPO | Admitting: Anesthesiology

## 2019-10-13 ENCOUNTER — Encounter: Payer: Self-pay | Admitting: Gastroenterology

## 2019-10-13 ENCOUNTER — Ambulatory Visit
Admission: RE | Admit: 2019-10-13 | Discharge: 2019-10-13 | Disposition: A | Discharge status: Home / Self Care | Payer: BC Managed Care – PPO | Attending: Gastroenterology | Admitting: Gastroenterology

## 2019-10-13 DIAGNOSIS — K644 Residual hemorrhoidal skin tags: Secondary | ICD-10-CM | POA: Insufficient documentation

## 2019-10-13 DIAGNOSIS — Z79899 Other long term (current) drug therapy: Secondary | ICD-10-CM | POA: Diagnosis not present

## 2019-10-13 DIAGNOSIS — K5010 Crohn's disease of large intestine without complications: Secondary | ICD-10-CM | POA: Insufficient documentation

## 2019-10-13 DIAGNOSIS — K6289 Other specified diseases of anus and rectum: Secondary | ICD-10-CM | POA: Insufficient documentation

## 2019-10-13 DIAGNOSIS — K50113 Crohn's disease of large intestine with fistula: Secondary | ICD-10-CM | POA: Diagnosis not present

## 2019-10-13 DIAGNOSIS — K50919 Crohn's disease, unspecified, with unspecified complications: Secondary | ICD-10-CM

## 2019-10-13 DIAGNOSIS — Z7952 Long term (current) use of systemic steroids: Secondary | ICD-10-CM | POA: Insufficient documentation

## 2019-10-13 HISTORY — PX: COLONOSCOPY WITH PROPOFOL: SHX5780

## 2019-10-13 SURGERY — COLONOSCOPY WITH PROPOFOL
Anesthesia: General

## 2019-10-13 MED ORDER — PROPOFOL 10 MG/ML IV BOLUS
INTRAVENOUS | Status: DC | PRN
  Administered 2019-10-13: 50 mg via INTRAVENOUS
  Administered 2019-10-13 (×2): 20 mg via INTRAVENOUS

## 2019-10-13 MED ORDER — LIDOCAINE HCL (PF) 2 % IJ SOLN
INTRAMUSCULAR | Status: AC
  Filled 2019-10-13: qty 10

## 2019-10-13 MED ORDER — PROPOFOL 500 MG/50ML IV EMUL
INTRAVENOUS | Status: DC | PRN
  Administered 2019-10-13: 150 ug/kg/min via INTRAVENOUS

## 2019-10-13 MED ORDER — GLYCOPYRROLATE 0.2 MG/ML IJ SOLN
INTRAMUSCULAR | Status: AC
  Filled 2019-10-13: qty 1

## 2019-10-13 MED ORDER — METHYLENE BLUE 1 % INJ SOLN
INTRAMUSCULAR | Status: AC
  Filled 2019-10-13: qty 10

## 2019-10-13 MED ORDER — SODIUM CHLORIDE 0.9 % IV SOLN
INTRAVENOUS | Status: DC
  Administered 2019-10-13: 08:00:00 1000 mL via INTRAVENOUS

## 2019-10-13 MED ORDER — PROPOFOL 500 MG/50ML IV EMUL
INTRAVENOUS | Status: AC
  Filled 2019-10-13: qty 50

## 2019-10-13 MED ORDER — MIDAZOLAM HCL 2 MG/2ML IJ SOLN
INTRAMUSCULAR | Status: DC | PRN
  Administered 2019-10-13: 2 mg via INTRAVENOUS

## 2019-10-13 MED ORDER — MIDAZOLAM HCL 2 MG/2ML IJ SOLN
INTRAMUSCULAR | Status: AC
  Filled 2019-10-13: qty 2

## 2019-10-13 NOTE — Anesthesia Preprocedure Evaluation (Signed)
Anesthesia Evaluation  Patient identified by MRN, date of birth, ID band Patient awake    Reviewed: Allergy & Precautions, H&P , NPO status , Patient's Chart, lab work & pertinent test results  History of Anesthesia Complications Negative for: history of anesthetic complications  Airway Mallampati: I  TM Distance: >3 FB Neck ROM: Full    Dental  (+) Teeth Intact, Dental Advisory Given   Pulmonary neg pulmonary ROS,    breath sounds clear to auscultation       Cardiovascular negative cardio ROS   Rhythm:Regular Rate:Normal     Neuro/Psych negative neurological ROS  negative psych ROS   GI/Hepatic Neg liver ROS,   Endo/Other  negative endocrine ROS  Renal/GU negative Renal ROS  negative genitourinary   Musculoskeletal negative musculoskeletal ROS (+)   Abdominal   Peds negative pediatric ROS (+)  Hematology negative hematology ROS (+)   Anesthesia Other Findings Past Medical History: No date: Crohn's disease ([place]) 01/2014: Inguinal hernia     Comment:  left No date: Seasonal allergies  Reproductive/Obstetrics                             Anesthesia Physical  Anesthesia Plan  ASA: II  Anesthesia Plan: General   Post-op Pain Management:    Induction: Intravenous  PONV Risk Score and Plan: Propofol infusion and TIVA  Airway Management Planned: Nasal Cannula  Additional Equipment:   Intra-op Plan:   Post-operative Plan:   Informed Consent: I have reviewed the patients History and Physical, chart, labs and discussed the procedure including the risks, benefits and alternatives for the proposed anesthesia with the patient or authorized representative who has indicated his/her understanding and acceptance.     Dental advisory given  Plan Discussed with: CRNA, Anesthesiologist and Surgeon  Anesthesia Plan Comments:         Anesthesia Quick Evaluation

## 2019-10-13 NOTE — Anesthesia Procedure Notes (Signed)
Date/Time: 10/13/2019 9:31 AM Performed by: Johnna Acosta, CRNA Pre-anesthesia Checklist: Patient identified, Emergency Drugs available, Suction available, Patient being monitored and Timeout performed Patient Re-evaluated:Patient Re-evaluated prior to induction Oxygen Delivery Method: Nasal cannula Preoxygenation: Pre-oxygenation with 100% oxygen Induction Type: IV induction

## 2019-10-13 NOTE — Op Note (Signed)
[facility] Gastroenterology Patient Name: Gregory Meyer Procedure Date: 10/13/2019 9:32 AM MRN: [id number] Account #: [id number] Date of Birth: Jun 06, 1981 Admit Type: Outpatient Age: 38 Room: [facility] ENDO ROOM 2 Gender: Male Note Status: Finalized Procedure:             Colonoscopy Indications:           Last colonoscopy: August 2020, Disease activity                         assessment of Crohn's disease of the colon, Assess                         therapeutic response to therapy of Crohn's disease of                         the colon Providers:             Lin Landsman MD, MD Medicines:             Monitored Anesthesia Care Complications:         No immediate complications. Estimated blood loss: None. Procedure:             Pre-Anesthesia Assessment:                        - Prior to the procedure, a History and Physical was                         performed, and patient medications and allergies were                         reviewed. The patient is competent. The risks and                         benefits of the procedure and the sedation options and                         risks were discussed with the patient. All questions                         were answered and informed consent was obtained.                         Patient identification and proposed procedure were                         verified by the physician, the nurse, the                         anesthesiologist, the anesthetist and the technician                         in the pre-procedure area in the procedure room in the                         endoscopy suite. Mental Status Examination: alert and                         oriented. Airway Examination: normal  oropharyngeal                         airway and neck mobility. Respiratory Examination:                         clear to auscultation. CV Examination: normal.                         Prophylactic Antibiotics: The patient does not  require                         prophylactic antibiotics. Prior Anticoagulants: The                         patient has taken no previous anticoagulant or                         antiplatelet agents. ASA Grade Assessment: II - A                         patient with mild systemic disease. After reviewing                         the risks and benefits, the patient was deemed in                         satisfactory condition to undergo the procedure. The                         anesthesia plan was to use monitored anesthesia care                         (MAC). Immediately prior to administration of                         medications, the patient was re-assessed for adequacy                         to receive sedatives. The heart rate, respiratory                         rate, oxygen saturations, blood pressure, adequacy of                         pulmonary ventilation, and response to care were                         monitored throughout the procedure. The physical                         status of the patient was re-assessed after the                         procedure.                        After obtaining informed consent, the colonoscope was  passed under direct vision. Throughout the procedure,                         the patient's blood pressure, pulse, and oxygen                         saturations were monitored continuously. The                         Colonoscope was introduced through the anus and                         advanced to the 15 cm into the ileum. The colonoscopy                         was performed without difficulty. The patient                         tolerated the procedure well. The quality of the bowel                         preparation was evaluated using the BBPS [facility] Bowel                         Preparation Scale) with scores of: Right Colon = 3,                         Transverse Colon = 3 and Left Colon = 3 (entire mucosa                          seen well with no residual staining, small fragments                         of stool or opaque liquid). The total BBPS score                         equals 9. Findings:      Skin tags were found on perianal exam.      The terminal ileum appeared normal.      The Simple Endoscopic Score for Crohn's Disease was determined based on       the endoscopic appearance of the mucosa in the following segments:      - Ileum: Findings include no ulcers present, no ulcerated surfaces, no       affected surfaces and no narrowings. Segment score: 0.      - Right Colon: Findings include no ulcers present, no ulcerated       surfaces, no affected surfaces and no narrowings. Segment score: 0.      - Transverse Colon: Findings include no ulcers present, no ulcerated       surfaces, no affected surfaces and no narrowings. Segment score: 0.      - Left Colon: Findings include no ulcers present, no ulcerated surfaces,       no affected surfaces and no narrowings. Segment score: 0.      - Rectum: Findings include large ulcers 0.5-2 cm in size, less than 10%       ulcerated surfaces, less than 50% of surfaces affected and no  narrowings. Segment score: 4. Ulcerated mucosa in distal 1cm of the       rectum/anal canal close to dentate line, separate biopsies performed.       Previously seen dysplastic lesion is not present. Mucosal scarring is       evident      - Total SES-CD aggregate score: 4. Biopsies were taken with a cold       forceps for histology. Impression:            - Perianal skin tags found on perianal exam.                        - The examined portion of the ileum was normal.                        - Simple Endoscopic Score for Crohn's Disease: 4,                         mucosal inflammatory changes secondary to Crohn's                         disease with colonic involvement. Biopsied.                        - Remarkable endoscopic improvement to remicade Recommendation:         - Discharge patient to home (with escort).                        - Resume regular diet today.                        - Continue present medications.                        - Await pathology results.                        - Repeat colonoscopy in 1 year to assess disease                         activity and to evaluate the response to therapy.                        - Return to my office as previously scheduled. Procedure Code(s):     --- Professional ---                        [phone], Colonoscopy, flexible; with biopsy, single or                         multiple Diagnosis Code(s):     --- Professional ---                        K50.10, Crohn's disease of large intestine without                         complications                        K64.4, Residual hemorrhoidal skin tags CPT copyright 2019 American Medical  Association. All rights reserved. The codes documented in this report are preliminary and upon coder review may  be revised to meet current compliance requirements. Dr. Ulyess Mort Lin Landsman MD, MD 10/13/2019 10:08:11 AM This report has been signed electronically. Number of Addenda: 0 Note Initiated On: 10/13/2019 9:32 AM Scope Withdrawal Time: 0 hours 15 minutes 55 seconds  Total Procedure Duration: 0 hours 19 minutes 15 seconds  Estimated Blood Loss:  Estimated blood loss: none.      [facility]

## 2019-10-13 NOTE — Anesthesia Post-op Follow-up Note (Signed)
Anesthesia QCDR form completed.        

## 2019-10-13 NOTE — Anesthesia Postprocedure Evaluation (Signed)
Anesthesia Post Note  Patient: Gregory Meyer  Procedure(s) Performed: COLONOSCOPY WITH PROPOFOL (N/A )  Patient location during evaluation: Endoscopy Anesthesia Type: General Level of consciousness: awake and alert Pain management: pain level controlled Vital Signs Assessment: post-procedure vital signs reviewed and stable Respiratory status: spontaneous breathing, nonlabored ventilation, respiratory function stable and patient connected to nasal cannula oxygen Cardiovascular status: blood pressure returned to baseline and stable Postop Assessment: no apparent nausea or vomiting Anesthetic complications: no     Last Vitals:  Vitals:   10/13/19 1040 10/13/19 1041  BP:  117/78  Pulse: 65 74  Resp: 16 17  Temp:    SpO2: 100% 100%    Last Pain:  Vitals:   10/13/19 1008  TempSrc: Tympanic  PainSc:                  Martha Clan

## 2019-10-13 NOTE — H&P (Signed)
Cephas Darby, MD [address]  [place]  [place], [place] [address]  Main: [phone]  Fax: [phone] Pager: [phone]  Primary Care Physician:  Minette Brine, FNP Primary Gastroenterologist:  Dr. Cephas Darby  Pre-Procedure History & Physical: HPI:  Gregory Meyer is a 38 y.o. male is here for an colonoscopy.   Past Medical History:  Diagnosis Date  . Crohn's disease ([place])   . Inguinal hernia 01/2014   left  . Seasonal allergies     Past Surgical History:  Procedure Laterality Date  . COLONOSCOPY     x 2-3  . COLONOSCOPY WITH PROPOFOL N/A 06/15/2019   Procedure: COLONOSCOPY WITH PROPOFOL;  Surgeon: Lin Landsman, MD;  Location: [facility] ENDOSCOPY;  Service: Gastroenterology;  Laterality: N/A;  . HERNIA REPAIR Right 2010  . INGUINAL HERNIA REPAIR Left 02/28/2014   Procedure: HERNIA REPAIR INGUINAL ADULT;  Surgeon: Adin Hector, MD;  Location: [facility];  Service: General;  Laterality: Left;  . INSERTION OF MESH Left 02/28/2014   Procedure: INSERTION OF MESH;  Surgeon: Adin Hector, MD;  Location: [facility];  Service: General;  Laterality: Left;    Prior to Admission medications   Medication Sig Start Date End Date Taking? Authorizing Provider  azaTHIOprine (IMURAN) 50 MG tablet Take 150 mg by mouth daily.    Yes [provider]  ciprofloxacin (CIPRO) 500 MG tablet Take by mouth.   Yes [provider]  metroNIDAZOLE (FLAGYL) 500 MG tablet Take by mouth.   Yes [provider]  Vitamin D, Ergocalciferol, (DRISDOL) 1.25 MG (50000 UT) CAPS capsule Take 1 capsule (50,000 Units total) by mouth every 7 (seven) days. 07/06/19  Yes Minette Brine, FNP  hydrocortisone (PROCTOZONE-HC) 2.5 % rectal cream APP REC TID FOR 14 DAYS 06/07/19   [provider]  polymixin-bacitracin (POLYSPORIN) 500-10000 UNIT/GM OINT ointment Apply 1 application topically 2 (two) times daily. 09/05/19   Minette Brine,  FNP  predniSONE (DELTASONE) 10 MG tablet Take by mouth.    [provider]    Allergies as of 08/15/2019  . (No Known Allergies)    Family History  Problem Relation Age of Onset  . Cancer Mother   . Hypertension Mother   . Diabetes Father     Social History   Socioeconomic History  . Marital status: Married    Spouse name: Not on file  . Number of children: Not on file  . Years of education: Not on file  . Highest education level: Not on file  Occupational History  . Not on file  Tobacco Use  . Smoking status: Never Smoker  . Smokeless tobacco: Never Used  Substance and Sexual Activity  . Alcohol use: Yes    Comment: occasionally   . Drug use: Yes    Types: Marijuana    Comment: 10/12/19  . Sexual activity: Yes  Other Topics Concern  . Not on file  Social History Narrative  . Not on file   Social Determinants of Health   Financial Resource Strain:   . Difficulty of Paying Living Expenses: Not on file  Food Insecurity:   . Worried About Charity fundraiser in the Last Year: Not on file  . Ran Out of Food in the Last Year: Not on file  Transportation Needs:   . Lack of Transportation (Medical): Not on file  . Lack of Transportation (Non-Medical): Not on file  Physical Activity:   . Days of Exercise  per Week: Not on file  . Minutes of Exercise per Session: Not on file  Stress:   . Feeling of Stress : Not on file  Social Connections:   . Frequency of Communication with Friends and Family: Not on file  . Frequency of Social Gatherings with Friends and Family: Not on file  . Attends Religious Services: Not on file  . Active Member of Clubs or Organizations: Not on file  . Attends Archivist Meetings: Not on file  . Marital Status: Not on file  Intimate Partner Violence:   . Fear of Current or Ex-Partner: Not on file  . Emotionally Abused: Not on file  . Physically Abused: Not on file  . Sexually Abused: Not on file    Review of  Systems: See HPI, otherwise negative ROS  Physical Exam: BP (!) 134/92   Pulse 77   Temp 98.5 F (36.9 C) (Temporal)   Resp 18   Ht 6' (1.829 m)   Wt 77.3 kg   SpO2 100%   BMI 23.10 kg/m  General:   Alert,  pleasant and cooperative in NAD Head:  Normocephalic and atraumatic. Neck:  Supple; no masses or thyromegaly. Lungs:  Clear throughout to auscultation.    Heart:  Regular rate and rhythm. Abdomen:  Soft, nontender and nondistended. Normal bowel sounds, without guarding, and without rebound.   Neurologic:  Alert and  oriented x4;  grossly normal neurologically.  Impression/Plan: Gregory Meyer is here for an colonoscopy to be performed for follow-up of Crohn's disease  Risks, benefits, limitations, and alternatives regarding  colonoscopy have been reviewed with the patient.  Questions have been answered.  All parties agreeable.   Sherri Sear, MD  10/13/2019, 8:26 AM

## 2019-10-13 NOTE — Transfer of Care (Signed)
Immediate Anesthesia Transfer of Care Note  Patient: Gregory Meyer  Procedure(s) Performed: COLONOSCOPY WITH PROPOFOL (N/A )  Patient Location: PACU  Anesthesia Type:General  Level of Consciousness: awake, alert  and oriented  Airway & Oxygen Therapy: Patient Spontanous Breathing  Post-op Assessment: Report given to RN and Post -op Vital signs reviewed and stable  Post vital signs: Reviewed and stable  Last Vitals:  Vitals Value Taken Time  BP 111/75 10/13/19 1007  Temp 36.1 C 10/13/19 1008  Pulse 76 10/13/19 1009  Resp 15 10/13/19 1009  SpO2 98 % 10/13/19 1009  Vitals shown include unvalidated device data.  Last Pain:  Vitals:   10/13/19 1008  TempSrc: Tympanic  PainSc:          Complications: No apparent anesthesia complications

## 2019-10-16 ENCOUNTER — Other Ambulatory Visit (HOSPITAL_COMMUNITY)
Admission: RE | Admit: 2019-10-16 | Discharge: 2019-10-16 | Disposition: A | Discharge status: Home / Self Care | Payer: BC Managed Care – PPO | Source: Ambulatory Visit | Attending: Nurse Practitioner | Admitting: Nurse Practitioner

## 2019-10-16 ENCOUNTER — Encounter: Payer: Self-pay | Admitting: *Deleted

## 2019-10-16 ENCOUNTER — Ambulatory Visit (INDEPENDENT_AMBULATORY_CARE_PROVIDER_SITE_OTHER): Payer: BC Managed Care – PPO | Admitting: Nurse Practitioner

## 2019-10-16 ENCOUNTER — Other Ambulatory Visit: Payer: Self-pay

## 2019-10-16 VITALS — BP 118/72 | HR 80 | Temp 98.5°F | Ht 72.4 in | Wt 168.2 lb

## 2019-10-16 DIAGNOSIS — Z0000 Encounter for general adult medical examination without abnormal findings: Secondary | ICD-10-CM

## 2019-10-16 DIAGNOSIS — Z113 Encounter for screening for infections with a predominantly sexual mode of transmission: Secondary | ICD-10-CM | POA: Insufficient documentation

## 2019-10-16 DIAGNOSIS — K50119 Crohn's disease of large intestine with unspecified complications: Secondary | ICD-10-CM | POA: Diagnosis not present

## 2019-10-16 DIAGNOSIS — H6122 Impacted cerumen, left ear: Secondary | ICD-10-CM | POA: Diagnosis not present

## 2019-10-16 DIAGNOSIS — R21 Rash and other nonspecific skin eruption: Secondary | ICD-10-CM

## 2019-10-16 DIAGNOSIS — E559 Vitamin D deficiency, unspecified: Secondary | ICD-10-CM | POA: Diagnosis not present

## 2019-10-16 LAB — POCT URINALYSIS DIPSTICK
Bilirubin, UA: NEGATIVE
Blood, UA: NEGATIVE
Glucose, UA: NEGATIVE
Ketones, UA: NEGATIVE
Leukocytes, UA: NEGATIVE
Nitrite, UA: NEGATIVE
Protein, UA: NEGATIVE
Spec Grav, UA: 1.02 (ref 1.010–1.025)
Urobilinogen, UA: 0.2 E.U./dL
pH, UA: 6 (ref 5.0–8.0)

## 2019-10-16 LAB — SURGICAL PATHOLOGY

## 2019-10-16 NOTE — Progress Notes (Signed)
This visit occurred during the SARS-CoV-2 public health emergency.  Safety protocols were in place, including screening questions prior to the visit, additional usage of staff PPE, and extensive cleaning of exam room while observing appropriate contact time as indicated for disinfecting solutions.  Subjective:     Patient ID: Gregory Meyer , male    DOB: [birth year] , 38 y.o.   MRN: [id number]   Chief Complaint  Patient presents with  . Annual Exam    HPI  Here for HM   Rough vesicles.  Rash This is a chronic problem. The current episode started more than 1 year ago. Location: groin area.    Men's preventive visit. Patient Health Questionnaire (PHQ-2) is    Office Visit from 10/16/2019 in [facility]  PHQ-2 Total Score  0     Patient is on a regular diet tries to avoid beef and pork. Marital status: Seperated. Does not go to the gym but is active.    Relevant history for alcohol use is:  Social History   Substance and Sexual Activity  Alcohol Use Yes   Comment: occasionally    Relevant history for tobacco use is:  Social History   Tobacco Use  Smoking Status Never Smoker  Smokeless Tobacco Never Used   Past Medical History:  Diagnosis Date  . Crohn's disease ([place])   . Inguinal hernia 01/2014   left  . Seasonal allergies      Family History  Problem Relation Age of Onset  . Cancer Mother   . Hypertension Mother   . Diabetes Father      Current Outpatient Medications:  .  azaTHIOprine (IMURAN) 50 MG tablet, Take 150 mg by mouth daily. , Disp: , Rfl:  .  Vitamin D, Ergocalciferol, (DRISDOL) 1.25 MG (50000 UT) CAPS capsule, Take 1 capsule (50,000 Units total) by mouth every 7 (seven) days., Disp: 12 capsule, Rfl: 0   No Known Allergies   Review of Systems  Skin: Positive for rash.     Today's Vitals   10/16/19 1051  BP: 118/72  Pulse: 80  Temp: 98.5 F (36.9 C)  TempSrc: Oral  Weight: 168 lb 3.2 oz (76.3 kg)  Height:  6' 0.4" (1.839 m)  PainSc: 0-No pain   Body mass index is 22.56 kg/m.   Objective:  Physical Exam Constitutional:      General: He is not in acute distress.    Appearance: Normal appearance.     Comments: thin  HENT:     Head: Normocephalic and atraumatic.     Right Ear: Tympanic membrane, ear canal and external ear normal. There is no impacted cerumen.     Left Ear: External ear normal. There is impacted cerumen.  Eyes:     Extraocular Movements: Extraocular movements intact.     Conjunctiva/sclera: Conjunctivae normal.     Pupils: Pupils are equal, round, and reactive to light.  Cardiovascular:     Rate and Rhythm: Normal rate and regular rhythm.     Pulses: Normal pulses.     Heart sounds: Normal heart sounds. No murmur.  Pulmonary:     Effort: Pulmonary effort is normal. No respiratory distress.     Breath sounds: Normal breath sounds.  Abdominal:     General: Abdomen is flat. Bowel sounds are normal. There is no distension.     Palpations: Abdomen is soft. There is no mass.  Musculoskeletal:        General: No tenderness. Normal range of  motion.  Skin:    General: Skin is warm and dry.     Capillary Refill: Capillary refill takes less than 2 seconds.     Findings: Lesion (groin area with raised vesicles, rough) present.  Neurological:     General: No focal deficit present.     Mental Status: He is alert and oriented to person, place, and time.  Psychiatric:        Mood and Affect: Mood normal.        Behavior: Behavior normal.        Thought Content: Thought content normal.        Judgment: Judgment normal.         Assessment And Plan:     1. Encounter for general adult medical examination w/o abnormal findings . Behavior modifications discussed and diet history reviewed.   . Pt will continue to exercise regularly and modify diet with low GI, plant based foods and decrease intake of processed foods.  . Recommend intake of daily multivitamin, Vitamin D, and  calcium.  . Recommend mammogram and colonoscopy for preventive screenings, as well as recommend immunizations that include TDAP - POCT Urinalysis Dipstick (81002)  2. Screening examination for STD (sexually transmitted disease)  - Urine cytology ancillary only - HIV antibody (with reflex) - T pallidum Screening [place]  3. Crohn's disease of large intestine with complication (HCC)  Chronic, continue with GI - BMP8+eGFR  4. Vitamin D deficiency  Will check vitamin D level and supplement as needed.     Also encouraged to spend 15 minutes in the sun daily.  - Vitamin D (25 hydroxy)  5. Impacted cerumen of left ear  Ear lavage done with success - Ear Lavage  6. Rash and nonspecific skin eruption  Vesicles present to groin  area  Will refer to dermatology - Ambulatory referral to Dermatology   Minette Brine, FNP    THE PATIENT IS ENCOURAGED TO PRACTICE SOCIAL DISTANCING DUE TO THE COVID-19 PANDEMIC.

## 2019-10-16 NOTE — Patient Instructions (Addendum)
Health Maintenance  Topic Date Due  . HIV Screening  06/22/1998  . TETANUS/TDAP  06/25/2026  . INFLUENZA VACCINE  Completed    Health Maintenance, Male Adopting a healthy lifestyle and getting preventive care are important in promoting health and wellness. Ask your health care provider about:  The right schedule for you to have regular tests and exams.  Things you can do on your own to prevent diseases and keep yourself healthy. What should I know about diet, weight, and exercise? Eat a healthy diet   Eat a diet that includes plenty of vegetables, fruits, low-fat dairy products, and lean protein.  Do not eat a lot of foods that are high in solid fats, added sugars, or sodium. Maintain a healthy weight Body mass index (BMI) is a measurement that can be used to identify possible weight problems. It estimates body fat based on height and weight. Your health care provider can help determine your BMI and help you achieve or maintain a healthy weight. Get regular exercise Get regular exercise. This is one of the most important things you can do for your health. Most adults should:  Exercise for at least 150 minutes each week. The exercise should increase your heart rate and make you sweat (moderate-intensity exercise).  Do strengthening exercises at least twice a week. This is in addition to the moderate-intensity exercise.  Spend less time sitting. Even light physical activity can be beneficial. Watch cholesterol and blood lipids Have your blood tested for lipids and cholesterol at 36 years of age, then have this test every 5 years. You may need to have your cholesterol levels checked more often if:  Your lipid or cholesterol levels are high.  You are older than 37 years of age.  You are at high risk for heart disease. What should I know about cancer screening? Many types of cancers can be detected early and may often be prevented. Depending on your health history and family  history, you may need to have cancer screening at various ages. This may include screening for:  Colorectal cancer.  Prostate cancer.  Skin cancer.  Lung cancer. What should I know about heart disease, diabetes, and high blood pressure? Blood pressure and heart disease  High blood pressure causes heart disease and increases the risk of stroke. This is more likely to develop in people who have high blood pressure readings, are of African descent, or are overweight.  Talk with your health care provider about your target blood pressure readings.  Have your blood pressure checked: ? Every 3-5 years if you are 46-25 years of age. ? Every year if you are 67 years old or older.  If you are between the ages of 71 and 29 and are a current or former smoker, ask your health care provider if you should have a one-time screening for abdominal aortic aneurysm (AAA). Diabetes Have regular diabetes screenings. This checks your fasting blood sugar level. Have the screening done:  Once every three years after age 48 if you are at a normal weight and have a low risk for diabetes.  More often and at a younger age if you are overweight or have a high risk for diabetes. What should I know about preventing infection? Hepatitis B If you have a higher risk for hepatitis B, you should be screened for this virus. Talk with your health care provider to find out if you are at risk for hepatitis B infection. Hepatitis C Blood testing is recommended for:  Everyone born from [birth year] through 1963.  Anyone with known risk factors for hepatitis C. Sexually transmitted infections (STIs)  You should be screened each year for STIs, including gonorrhea and chlamydia, if: ? You are sexually active and are younger than 36 years of age. ? You are older than 36 years of age and your health care provider tells you that you are at risk for this type of infection. ? Your sexual activity has changed since you were last  screened, and you are at increased risk for chlamydia or gonorrhea. Ask your health care provider if you are at risk.  Ask your health care provider about whether you are at high risk for HIV. Your health care provider may recommend a prescription medicine to help prevent HIV infection. If you choose to take medicine to prevent HIV, you should first get tested for HIV. You should then be tested every 3 months for as long as you are taking the medicine. Follow these instructions at home: Lifestyle  Do not use any products that contain nicotine or tobacco, such as cigarettes, e-cigarettes, and chewing tobacco. If you need help quitting, ask your health care provider.  Do not use street drugs.  Do not share needles.  Ask your health care provider for help if you need support or information about quitting drugs. Alcohol use  Do not drink alcohol if your health care provider tells you not to drink.  If you drink alcohol: ? Limit how much you have to 0-2 drinks a day. ? Be aware of how much alcohol is in your drink. In the [place] one drink equals one 12 oz bottle of beer (355 mL), one 5 oz glass of wine (148 mL), or one 1 oz glass of hard liquor (44 mL). General instructions  Schedule regular health, dental, and eye exams.  Stay current with your vaccines.  Tell your health care provider if: ? You often feel depressed. ? You have ever been abused or do not feel safe at home. Summary  Adopting a healthy lifestyle and getting preventive care are important in promoting health and wellness.  Follow your health care provider's instructions about healthy diet, exercising, and getting tested or screened for diseases.  Follow your health care provider's instructions on monitoring your cholesterol and blood pressure. This information is not intended to replace advice given to you by your health care provider. Make sure you discuss any questions you have with your health care provider. Document  Released: 04/09/2008 Document Revised: 10/05/2018 Document Reviewed: 10/05/2018 Elsevier Patient Education  2020 [place], Adult The ears produce a substance called earwax that helps keep bacteria out of the ear and protects the skin in the ear canal. Occasionally, earwax can build up in the ear and cause discomfort or hearing loss. What increases the risk? This condition is more likely to develop in people who:  Are male.  Are elderly.  Naturally produce more earwax.  Clean their ears often with cotton swabs.  Use earplugs often.  Use in-ear headphones often.  Wear hearing aids.  Have narrow ear canals.  Have earwax that is overly thick or sticky.  Have eczema.  Are dehydrated.  Have excess hair in the ear canal. What are the signs or symptoms? Symptoms of this condition include:  Reduced or muffled hearing.  A feeling of fullness in the ear or feeling that the ear is plugged.  Fluid coming from the ear.  Ear pain.  Ear itch.  Ringing  in the ear.  Coughing.  An obvious piece of earwax that can be seen inside the ear canal. How is this diagnosed? This condition may be diagnosed based on:  Your symptoms.  Your medical history.  An ear exam. During the exam, your health care provider will look into your ear with an instrument called an otoscope. You may have tests, including a hearing test. How is this treated? This condition may be treated by:  Using ear drops to soften the earwax.  Having the earwax removed by a health care provider. The health care provider may: ? Flush the ear with water. ? Use an instrument that has a loop on the end (curette). ? Use a suction device.  Surgery to remove the wax buildup. This may be done in severe cases. Follow these instructions at home:   Take over-the-counter and prescription medicines only as told by your health care provider.  Do not put any objects, including cotton swabs, into  your ear. You can clean the opening of your ear canal with a washcloth or facial tissue.  Follow instructions from your health care provider about cleaning your ears. Do not over-clean your ears.  Drink enough fluid to keep your urine clear or pale yellow. This will help to thin the earwax.  Keep all follow-up visits as told by your health care provider. If earwax builds up in your ears often or if you use hearing aids, consider seeing your health care provider for routine, preventive ear cleanings. Ask your health care provider how often you should schedule your cleanings.  If you have hearing aids, clean them according to instructions from the manufacturer and your health care provider. Contact a health care provider if:  You have ear pain.  You develop a fever.  You have blood, pus, or other fluid coming from your ear.  You have hearing loss.  You have ringing in your ears that does not go away.  Your symptoms do not improve with treatment.  You feel like the room is spinning (vertigo). Summary  Earwax can build up in the ear and cause discomfort or hearing loss.  The most common symptoms of this condition include reduced or muffled hearing and a feeling of fullness in the ear or feeling that the ear is plugged.  This condition may be diagnosed based on your symptoms, your medical history, and an ear exam.  This condition may be treated by using ear drops to soften the earwax or by having the earwax removed by a health care provider.  Do not put any objects, including cotton swabs, into your ear. You can clean the opening of your ear canal with a washcloth or facial tissue. This information is not intended to replace advice given to you by your health care provider. Make sure you discuss any questions you have with your health care provider. Document Released: 11/19/2004 Document Revised: 09/24/2017 Document Reviewed: 12/23/2016 Elsevier Patient Education  2020 [employer].

## 2019-10-17 LAB — BMP8+EGFR
BUN/Creatinine Ratio: 16 (ref 9–20)
BUN: 12 mg/dL (ref 6–20)
CO2: 27 mmol/L (ref 20–29)
Calcium: 9.1 mg/dL (ref 8.7–10.2)
Chloride: 104 mmol/L (ref 96–106)
Creatinine, Ser: 0.77 mg/dL (ref 0.76–1.27)
GFR calc Af Amer: 135 mL/min/{1.73_m2} (ref >59)
GFR calc non Af Amer: 117 mL/min/{1.73_m2} (ref >59)
Glucose: 86 mg/dL (ref 65–99)
Potassium: 4.2 mmol/L (ref 3.5–5.2)
Sodium: 143 mmol/L (ref 134–144)

## 2019-10-17 LAB — HIV ANTIBODY (ROUTINE TESTING W REFLEX): HIV Screen 4th Generation wRfx: NONREACTIVE

## 2019-10-17 LAB — T PALLIDUM SCREENING CASCADE: T pallidum Antibodies (TP-PA): NONREACTIVE

## 2019-10-17 LAB — VITAMIN D 25 HYDROXY (VIT D DEFICIENCY, FRACTURES): Vit D, 25-Hydroxy: 17.8 ng/mL — ABNORMAL LOW (ref 30.0–100.0)

## 2019-10-18 LAB — URINE CYTOLOGY ANCILLARY ONLY
Chlamydia: NEGATIVE
Comment: NEGATIVE
Comment: NEGATIVE
Comment: NORMAL
Neisseria Gonorrhea: NEGATIVE
Trichomonas: NEGATIVE

## 2019-10-21 ENCOUNTER — Other Ambulatory Visit: Payer: Self-pay | Admitting: Nurse Practitioner

## 2019-10-21 MED ORDER — VITAMIN D (ERGOCALCIFEROL) 1.25 MG (50000 UNIT) PO CAPS: 50000.0000 [IU] | ORAL_CAPSULE | ORAL | 1 refills | Status: DC

## 2019-10-24 ENCOUNTER — Telehealth: Payer: Self-pay

## 2019-10-24 NOTE — Telephone Encounter (Signed)
Patient notified of his results. YRL,RMA

## 2019-11-21 ENCOUNTER — Telehealth: Payer: Self-pay | Admitting: Gastroenterology

## 2019-11-21 NOTE — Telephone Encounter (Signed)
-----   Message from Lin Landsman, MD sent at 11/20/2019  4:59 PM EST ----- Regarding: Twinrix vaccine and labs, follow up He should be receiving second dose of Twinrix vaccine ASAP He will also need CBC, LFTs by end of February I should see him in 12/2019  RV

## 2019-11-21 NOTE — Telephone Encounter (Signed)
Pt is scheduled for vaccine apt for 11/28/19 and f/u apt  With Dr. Marius Ditch for 12/26/19 pt aware he will receive a call from nurse to set up lab work

## 2019-11-22 ENCOUNTER — Other Ambulatory Visit: Payer: Self-pay

## 2019-11-22 DIAGNOSIS — K50111 Crohn's disease of large intestine with rectal bleeding: Secondary | ICD-10-CM

## 2019-11-22 NOTE — Telephone Encounter (Signed)
LVM notifying pt of nurse visit for Twinrix vaccine also lab orders.

## 2019-11-27 ENCOUNTER — Ambulatory Visit: Payer: BC Managed Care – PPO

## 2019-11-28 ENCOUNTER — Other Ambulatory Visit: Payer: Self-pay

## 2019-11-28 ENCOUNTER — Ambulatory Visit (INDEPENDENT_AMBULATORY_CARE_PROVIDER_SITE_OTHER): Payer: Self-pay | Admitting: Gastroenterology

## 2019-11-28 DIAGNOSIS — Z23 Encounter for immunization: Secondary | ICD-10-CM

## 2019-11-30 NOTE — Progress Notes (Signed)
Pt was not seen.

## 2019-12-25 ENCOUNTER — Other Ambulatory Visit: Payer: Self-pay

## 2019-12-25 DIAGNOSIS — K50111 Crohn's disease of large intestine with rectal bleeding: Secondary | ICD-10-CM

## 2019-12-26 ENCOUNTER — Encounter: Payer: Self-pay | Admitting: Gastroenterology

## 2019-12-26 ENCOUNTER — Ambulatory Visit (INDEPENDENT_AMBULATORY_CARE_PROVIDER_SITE_OTHER): Payer: BC Managed Care – PPO | Admitting: Gastroenterology

## 2019-12-26 ENCOUNTER — Other Ambulatory Visit: Payer: Self-pay

## 2019-12-26 VITALS — BP 132/84 | HR 65 | Temp 98.2°F | Wt 173.5 lb

## 2019-12-26 DIAGNOSIS — K50113 Crohn's disease of large intestine with fistula: Secondary | ICD-10-CM | POA: Diagnosis not present

## 2019-12-26 DIAGNOSIS — K50111 Crohn's disease of large intestine with rectal bleeding: Secondary | ICD-10-CM

## 2019-12-26 LAB — CBC WITH DIFFERENTIAL/PLATELET
Basophils Absolute: 0 10*3/uL (ref 0.0–0.2)
Basos: 1 %
EOS (ABSOLUTE): 0 10*3/uL (ref 0.0–0.4)
Eos: 1 %
Hematocrit: 39.7 % (ref 37.5–51.0)
Hemoglobin: 13.3 g/dL (ref 13.0–17.7)
Immature Grans (Abs): 0 10*3/uL (ref 0.0–0.1)
Immature Granulocytes: 0 %
Lymphocytes Absolute: 1.3 10*3/uL (ref 0.7–3.1)
Lymphs: 32 %
MCH: 31.1 pg (ref 26.6–33.0)
MCHC: 33.5 g/dL (ref 31.5–35.7)
MCV: 93 fL (ref 79–97)
Monocytes Absolute: 0.3 10*3/uL (ref 0.1–0.9)
Monocytes: 7 %
Neutrophils Absolute: 2.5 10*3/uL (ref 1.4–7.0)
Neutrophils: 59 %
Platelets: 221 10*3/uL (ref 150–450)
RBC: 4.28 x10E6/uL (ref 4.14–5.80)
RDW: 12.9 % (ref 11.6–15.4)
WBC: 4.2 10*3/uL (ref 3.4–10.8)

## 2019-12-26 LAB — HEPATIC FUNCTION PANEL
ALT: 16 IU/L (ref 0–44)
AST: 19 IU/L (ref 0–40)
Albumin: 4.6 g/dL (ref 4.0–5.0)
Alkaline Phosphatase: 64 IU/L (ref 39–117)
Bilirubin Total: 0.6 mg/dL (ref 0.0–1.2)
Bilirubin, Direct: 0.16 mg/dL (ref 0.00–0.40)
Total Protein: 7 g/dL (ref 6.0–8.5)

## 2019-12-26 NOTE — Progress Notes (Signed)
Cephas Darby, MD [address]  [place]  [place], [place] [address]  Main: [phone]  Fax: [phone]    Gastroenterology Consultation  Referring Provider:     Minette Brine, [place] Primary Care Physician:  Minette Brine, FNP Primary Gastroenterologist:  Dr. Cephas Darby Reason for Consultation:     Crohn's disease of the colon, perianal Crohn's        HPI:   Gregory Meyer is a 39 y.o. male referred by Dr. Minette Brine, [place]  for consultation & management of Crohn's disease of the colon, perianal Crohn's  Follow-up visit 08/14/2019 Patient received 2 induction doses of Remicade so far.  Due for third induction dose Friday this week.  Patient reports tolerating the medication very well without any side effects.  He is currently taking ciprofloxacin once a day, metronidazole twice daily.  Decrease prednisone to 10 mg daily for last 2 weeks.  He continues to take azathioprine 150 mg daily.  With regards to his symptoms, he reports feeling 50% better.  Less urgency, reports having bowel movements about 3 times in the morning, 2 times in the evening.  Appetite is intact, able to eat good 3 meals a day, energy levels are better.  Denies any blood in the stools, no evidence of rectal pain, fistula drainage.  He gained about 12 pounds in last 2 months.  Follow-up visit 12/26/19: Gregory Meyer has been doing remarkably well high-dose Remicade along with azathioprine 100 mg daily.  He underwent repeat colonoscopy in 07/2019 which revealed significant endoscopic improvement with mild inflammation in the rectum only.  I also performed chromoendoscopy with no evidence of any abdominal lesions.  The pathology did not reveal any evidence of dysplasia, showed only chronic mild active proctitis Clinically, he reports having formed bowel movements and sometimes hard.  He denies any rectal pain or drainage from fistulous.  He continues to gain weight.  Continues to work. He is recently  separated from his wife but adjusting well.  Denies any stress. He just changed his insurance, waiting for his next Remicade dose to be approved   Crohn's disease classification:  Age: 31 to 31 Location:colonic  Behavior: non stricturing, penetrating  Perianal: Yes  IBD diagnosis: Patient is diagnosed with Crohn's disease when he was 39 years old, his symptoms were rectal bleeding, rectal pain, bloody diarrhea, rectal discharge, weight loss  Disease course: Right after diagnosis, patient was started on Remicade monotherapy.  Due to his insurance issues, he has been on and off Remicade to date.  The longest duration of Remicade was for 2 years without interruption.  His last Remicade dose was about 1 year ago.  He was receiving every 12 weeks when he was initially diagnosed and moved every 8 weeks.  He reports having breakthrough symptoms with 8 weeks interval on Remicade.  Patient was seeing Dr. Alessandra Bevels with [facility] gastroenterology as well as Dr. Collene Mares in [place].  He lost insurance, therefore lost to follow-up.  Currently, he has insurance to his wife and wanted to see an IBD specialist in [place].  Patient reports that he was partially responding to Remicade.  He was receiving Flagyl as needed for rectal discharge which helps.  He does have severe rectal discomfort from external hemorrhoids which bleed from time to time.  His perianal skin is always sore and sometimes have to wear depends when discharge is worse.  He also has leakage of the stool.  He has been limiting his p.o. intake due  to rectal urgency, diarrhea.  He has 4-5 bowel movements daily.  He lost about 20 pounds within last 1 year.  He does have normocytic anemia.  Patient is started on Flagyl by his PCP.  05/2019:  continued antibiotics with Cipro and metronidazole for perianal Crohn's.  MRI pelvis confirmed perianal fistula with no underlying abscess.  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  Patient is evaluated by Dr. Vincenza Hews at [facility] center due to evidence of dysplasia in the rectum recommended to maximize medical therapy.  After the basic work-up, patient is started on high-dose Remicade 10 mg/kg body weight, azathioprine 150 mg daily, short course of prednisone, antibiotics.  Extra intestinal manifestations: None  IBD surgical history: None  Imaging:  MRE none MRI pelvis 06/08/2019 IMPRESSION: 1. Transsphincteric left perianal fistula arising at the 2 o'clock position and tracking deep to the skin along the left intergluteal fold. Signal void within the track as it extends to the skin may be related to gas within the track or the presence of a seton. 2. No rim enhancing, drainable perianal abscess.  CTE in 2011, normal SBFT none  Procedures: Colonoscopy 12/24/2016 at [facility] gastroenterology by Dr. 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 - The examined portion of the ileum was normal. Biopsied. - Simple Endoscopic Score for Crohn's Disease 16:, mucosal inflammatory changes secondary to Crohn's disease with colonic involvement. Biopsied. - Crohn's disease with colonic involvement.  Inflammation was found in the distal rectum most severe, mild in descending colon, in the transverse colon, in the ascending colon and at the cecum.  DIAGNOSIS:  A. TERMINAL ILEUM; COLD BIOPSY:  - BENIGN ENTERIC MUCOSA WITH NORMAL VILLOUS ARCHITECTURE AND REACTIVE  FOLLICULAR LYMPHOID HYPERPLASIA.  - NEGATIVE FOR FEATURES OF ACTIVE ENTERITIS/ILEITIS.  - NEGATIVE FOR GRANULOMA, DYSPLASIA, AND MALIGNANCY.   B. COLON, RIGHT; COLD BIOPSY:  - CHRONIC COLITIS WITH MILD ACTIVITY (CRYPTITIS).  - SMALL SUBMUCOSAL NONCASEATING GRANULOMA.  - NEGATIVE FOR DYSPLASIA AND MALIGNANCY.   C. COLON, LEFT; COLD BIOPSY:  - CHRONIC COLITIS WITHOUT HISTOLOGIC EVIDENCE OF ACTIVE MUCOSAL  INFLAMMATION.  - SMALL SUBMUCOSAL NONCASEATING GRANULOMA.  - NEGATIVE FOR DYSPLASIA AND MALIGNANCY.   D. RECTUM; COLD BIOPSY:  - CHRONIC COLITIS WITH SEVERE ACTIVITY (ULCERATION AND CRYPT ABSCESS).  - POLYPOID LOW GRADE DYSPLASIA WITH TUBULAR ARCHITECTURE.  - NEGATIVE FOR GRANULOMA, DYSPLASIA, AND MALIGNANCY.   Colonoscopy 10/13/2019 - Perianal skin tags found on perianal exam. - The examined portion of the ileum was normal. - Simple Endoscopic Score for Crohn's Disease: 4, mucosal inflammatory changes secondary to Crohn's disease with colonic involvement. Biopsied. - Remarkable endoscopic improvement to remicade  DIAGNOSIS:  A. RIGHT COLON; COLD BIOPSY:  - COLONIC MUCOSA WITH INTACT CRYPT ARCHITECTURE.  - NEGATIVE FOR ACTIVE COLITIS, DYSPLASIA, AND MALIGNANCY.   B. LEFT COLON;  COLD BIOPSY:  - COLONIC MUCOSA WITH INTACT CRYPT ARCHITECTURE.  - PANETH CELL METAPLASIA, MULTIFOCAL.  - NEGATIVE FOR ACTIVE INFLAMMATION, DYSPLASIA, AND MALIGNANCY.  - FEATURES ARE CONSISTENT WITH QUIESCENT COLITIS.   C. RECTUM; COLD BIOPSY:  - CHRONIC PROCTITIS WITH MINIMAL ACTIVE INFLAMMATION.  - NEGATIVE FOR DYSPLASIA AND MALIGNANCY.   D. RECTUM, DISTAL; COLD BIOPSY:  - CHRONIC PROCTITIS WITH MILD ACTIVE INFLAMMATION.  - NEGATIVE  FOR DYSPLASIA AND MALIGNANCY.   Comment:  There is proctitis with crypt distortion and plasma cell infiltrates.  Significant neutrophilic infiltrates are present only in the distal  rectum, with neutrophils present in the lamina propria and surface  epithelium. All of the samples are negative for viral cytopathic  effects, granulomas, and dysplasia. The previous pathology report and  slides were reviewed ([id number], 06/15/2019). In comparison, the  current rectal samples show greatly reduced inflammation, without  evidence of epithelial atypia.   Upper Endoscopy none  VCE none  IBD medications:  Steroids: Prednisone responsive, tolerating well 5-ASA: None  immunomodulators: AZA started in 06/2019, methotrexate, nave TPMT status homozygous Biologics: Anti TNFs: Remicade monotherapy, started at age 27, previously every 12 weeks then moved to every 8 weeks And infliximab antibodies undetected on 06/09/2019 Restarted Remicade, first induction dose was 5 mg/kg, increase to 10 mg/kg from second induction dose Anti Integrins: Nave Ustekinumab: Nave Tofactinib: Nave Clinical trial: None  NSAIDs: None  Antiplts/Anticoagulants/Anti thrombotics: None  His cousin has Crohn's disease Denies family history of GI malignancy  Past Medical History:  Diagnosis Date  . Crohn's disease ([place])   . Inguinal hernia 01/2014   left  . Seasonal allergies     Past Surgical History:  Procedure Laterality Date  . COLONOSCOPY     x 2-3  . COLONOSCOPY WITH PROPOFOL N/A 06/15/2019   Procedure: COLONOSCOPY WITH PROPOFOL;  Surgeon: Lin Landsman, MD;  Location: [facility] ENDOSCOPY;  Service: Gastroenterology;  Laterality: N/A;  . COLONOSCOPY WITH PROPOFOL N/A 10/13/2019   Procedure: COLONOSCOPY WITH PROPOFOL;  Surgeon: Lin Landsman, MD;  Location: [facility] ENDOSCOPY;  Service: Gastroenterology;  Laterality: N/A;  . HERNIA REPAIR Right 2010  . INGUINAL HERNIA REPAIR Left 02/28/2014    Procedure: HERNIA REPAIR INGUINAL ADULT;  Surgeon: Adin Hector, MD;  Location: [place];  Service: General;  Laterality: Left;  . INSERTION OF MESH Left 02/28/2014   Procedure: INSERTION OF MESH;  Surgeon: Adin Hector, MD;  Location: [facility];  Service: General;  Laterality: Left;    Current Outpatient Medications:  .  azaTHIOprine (IMURAN) 50 MG tablet, Take 150 mg by mouth daily. , Disp: , Rfl:  .  Vitamin D, Ergocalciferol, (DRISDOL) 1.25 MG (50000 UT) CAPS capsule, Take 1 capsule (50,000 Units total) by mouth 2 (two) times a week., Disp: 24 capsule, Rfl: 1    Family History  Problem Relation Age of Onset  . Cancer Mother   . Hypertension Mother   . Diabetes Father      Social History   Tobacco Use  . Smoking status: Never Smoker  . Smokeless tobacco: Never Used  Substance Use Topics  . Alcohol use: Yes    Alcohol/week: 2.0 standard drinks    Types: 2 Standard drinks or equivalent per week    Comment: occasionally   . Drug use: Yes    Types: Marijuana    Comment: 10/12/19    Allergies as of 12/26/2019  . (No Known Allergies)    Review of Systems:  All systems reviewed and negative except where noted in HPI.   Physical Exam:  BP 132/84 (BP Location: Left Arm, Patient Position: Sitting, Cuff Size: Normal)   Pulse 65   Temp 98.2 F (36.8 C) (Oral)   Wt 173 lb 8 oz (78.7 kg)   BMI 23.27 kg/m  No LMP for male patient.  General:   Alert, moderately built, moderately nourished, pleasant and cooperative in NAD, in good spirits Head:  Normocephalic and atraumatic. Eyes:  Sclera clear, no icterus.   Conjunctiva pink. Ears:  Normal auditory acuity. Nose:  No deformity, discharge, or lesions. Mouth:  No deformity or lesions,oropharynx pink & moist. Neck:  Supple; no masses or thyromegaly. Lungs:  Respirations even and unlabored.  Clear throughout to auscultation.   No wheezes, crackles, or rhonchi. No acute distress. Heart:   Regular rate and rhythm; no murmurs, clicks, rubs, or gallops. Abdomen:  Normal bowel sounds. Soft, non-tender and non-distended without masses, hepatosplenomegaly or hernias noted.  No guarding or rebound tenderness.   Rectal: Significant improvement in size of the external hemorrhoids and hemorrhoidal skin tags, nontender, not inflamed Msk:  Symmetrical without gross deformities. Good, equal movement & strength bilaterally. Pulses:  Normal pulses noted. Extremities:  No clubbing or edema.  No cyanosis. Neurologic:  Alert and oriented x3;  grossly normal neurologically. Skin:  Intact without significant lesions or rashes. No jaundice. Psych:  Alert and cooperative. Normal mood and affect.  Imaging Studies: Reviewed  Assessment and Plan:   Gregory Meyer is a 39 y.o. male with Crohn's colitis, worse in rectum as well as perianal Crohn'n diagnosed at age 32, previously on Remicade monotherapy, exacerbation of Crohn's secondary to discontinuation of medication.  Colonoscopy confirmed mild colitis, severe proctitis with dysplasia in the rectum, started on high-dose Remicade, azathioprine, antibiotics and prednisone taper in 07/2019.  Patient is responding well to the above regimen, currently in clinical remission, regained weight, significant endoscopic healing and no evidence of low-grade dysplasia  Crohn's colitis Continue high-dose Remicade Continue azathioprine 100 mg daily Normal TPMT activity Labs every 3 months Recommend flexible sigmoidoscopy to assess rectum in 6 months given history of low-grade dysplasia  Perianal fistula: Resolved Previously treated with metronidazole and ciprofloxacin  IBD Health Maintenance  1.TB status: Was indeterminate while on prednisone on 06/09/2019, repeat returned negative on 06/15/2019 2. Anemia: Resolved, normal iron, B12 and folate levels 3.Immunizations: Twinrix first dose on 08/14/2019, second dose 11/28/19, Influenza 06/29/2019, prevnar  08/14/2019, pneumovax to be administered in 01/2020, Varicella unknown status, Zoster recommend Shingrix vaccine by PCP 4.Cancer screening I) Colon cancer/dysplasia surveillance: Low-grade dysplasia in the rectum, repeat colonoscopy with biopsies in 09/2019 with no evidence of dysplasia.  Will perform flexible sigmoidoscopy in 6 months II) Cervical cancer: n/a III) Skin cancer - counseled about annual skin exam by dermatology and skin protection in summer using sun screen SPF > 50, clothing 5.Bone health Vitamin D status: 29.1, mildly low, recommend calcium plus vitamin D daily Bone density testing: Not done 5. Labs: every 3 months 6. Smoking: Ex-smoker, smokes marijuana regularly 7. NSAIDs and Antibiotics use: none   Follow up in 4 months   Cephas Darby, MD

## 2019-12-28 ENCOUNTER — Telehealth: Payer: Self-pay | Admitting: Gastroenterology

## 2019-12-28 NOTE — Telephone Encounter (Signed)
Faxed last office visit note to them

## 2019-12-28 NOTE — Telephone Encounter (Signed)
Gregory Meyer from [facility] left vm regarding  [facility] rx prior authorization  For Remikate infusion they need Clinicals and treatment plan  Frequency of rx dosage fax clinicals to [phone] ref [id number]  cb [phone]

## 2020-01-03 ENCOUNTER — Telehealth: Payer: Self-pay

## 2020-01-03 NOTE — Telephone Encounter (Signed)
Patient had [employer] infusion yesterday

## 2020-01-17 ENCOUNTER — Ambulatory Visit: Payer: Self-pay | Admitting: Gastroenterology

## 2020-02-12 ENCOUNTER — Encounter: Payer: Self-pay | Admitting: *Deleted

## 2020-02-12 ENCOUNTER — Ambulatory Visit: Payer: BC Managed Care – PPO

## 2020-03-29 ENCOUNTER — Encounter: Payer: Self-pay | Admitting: Gastroenterology

## 2020-04-15 ENCOUNTER — Ambulatory Visit: Payer: BC Managed Care – PPO | Admitting: Nurse Practitioner

## 2020-07-02 ENCOUNTER — Telehealth: Payer: Self-pay

## 2020-07-02 DIAGNOSIS — K50111 Crohn's disease of large intestine with rectal bleeding: Secondary | ICD-10-CM

## 2020-07-02 NOTE — Telephone Encounter (Signed)
Gregory Meyer with [employer] called and left me a voicemail stating that patient was called about his next infusion and he informed her that he weighs now 185 pounds. She states this would mean his medication of Remicade would need to be increased to 800 to 932m. They are wanting to know if you want the medication to be increased.  CB [phone]

## 2020-07-02 NOTE — Telephone Encounter (Signed)
Called and left a message for call back. Called [facility] infusion and informed susie of this information Sent patient a mychart message informing them also

## 2020-07-02 NOTE — Telephone Encounter (Signed)
Patient is past due for follow-up and also labs while on Remicade and azathioprine.  He should make a follow-up to see me sometime this week Not sure if they are drawing labs with infusion?  Please go ahead and order CBC, CMP.  Recommend to continue current dose only until I see him  Gregory Darby, MD [address]  [place]  [place] [address], [place] [address]  Main: [phone]  Fax: [phone] Pager: [phone]

## 2020-07-02 NOTE — Addendum Note (Signed)
Addended by: Ulyess Blossom L on: 07/02/2020 03:44 PM   Modules accepted: Orders

## 2020-07-03 NOTE — Telephone Encounter (Signed)
Called patient and patient states he can not come in this week because of work. Patient states he can come next Tuesday or Friday. We did not have anything next Friday but overbook patient on Tuesday. Informed patient that he needed to go for lab work before appointment. He could go to any [facility] location. Patient verbalized understanding

## 2020-07-05 ENCOUNTER — Encounter: Payer: Self-pay | Admitting: Gastroenterology

## 2020-07-09 ENCOUNTER — Telehealth: Payer: Self-pay

## 2020-07-09 ENCOUNTER — Other Ambulatory Visit: Payer: BC Managed Care – PPO

## 2020-07-09 ENCOUNTER — Encounter: Payer: Self-pay | Admitting: Gastroenterology

## 2020-07-09 ENCOUNTER — Other Ambulatory Visit: Payer: Self-pay | Admitting: *Deleted

## 2020-07-09 ENCOUNTER — Other Ambulatory Visit: Payer: Self-pay

## 2020-07-09 ENCOUNTER — Ambulatory Visit: Payer: BC Managed Care – PPO | Admitting: Gastroenterology

## 2020-07-09 VITALS — BP 150/82 | HR 96 | Temp 98.4°F | Ht 72.4 in | Wt 180.1 lb

## 2020-07-09 DIAGNOSIS — K50111 Crohn's disease of large intestine with rectal bleeding: Secondary | ICD-10-CM | POA: Diagnosis not present

## 2020-07-09 DIAGNOSIS — Z20822 Contact with and (suspected) exposure to covid-19: Secondary | ICD-10-CM

## 2020-07-09 DIAGNOSIS — K50113 Crohn's disease of large intestine with fistula: Secondary | ICD-10-CM | POA: Diagnosis not present

## 2020-07-09 DIAGNOSIS — Z23 Encounter for immunization: Secondary | ICD-10-CM

## 2020-07-09 NOTE — Progress Notes (Signed)
Cephas Darby, MD [address]  [place]  [place], [place] [address]  Main: [phone]  Fax: [phone]    Gastroenterology Consultation  Referring Provider:     Minette Brine, [place] Primary Care Physician:  Minette Brine, FNP Primary Gastroenterologist:  Dr. Cephas Darby Reason for Consultation:     Crohn's disease of the colon, perianal Crohn's        HPI:   KAYSEN DEAL is a 39 y.o. male referred by Dr. Minette Brine, [place]  for consultation & management of Crohn's disease of the colon, perianal Crohn's  Follow-up visit 08/14/2019 Patient received 2 induction doses of Remicade so far.  Due for third induction dose Friday this week.  Patient reports tolerating the medication very well without any side effects.  He is currently taking ciprofloxacin once a day, metronidazole twice daily.  Decrease prednisone to 10 mg daily for last 2 weeks.  He continues to take azathioprine 150 mg daily.  With regards to his symptoms, he reports feeling 50% better.  Less urgency, reports having bowel movements about 3 times in the morning, 2 times in the evening.  Appetite is intact, able to eat good 3 meals a day, energy levels are better.  Denies any blood in the stools, no evidence of rectal pain, fistula drainage.  He gained about 12 pounds in last 2 months.  Follow-up visit 12/26/19: Mr. Breton has been doing remarkably well high-dose Remicade along with azathioprine 100 mg daily.  He underwent repeat colonoscopy in 07/2019 which revealed significant endoscopic improvement with mild inflammation in the rectum only.  I also performed chromoendoscopy with no evidence of any abdominal lesions.  The pathology did not reveal any evidence of dysplasia, showed only chronic mild active proctitis Clinically, he reports having formed bowel movements and sometimes hard.  He denies any rectal pain or drainage from fistulous.  He continues to gain weight.  Continues to work. He is recently  separated from his wife but adjusting well.  Denies any stress. He just changed his insurance, waiting for his next Remicade dose to be approved  Follow-up visit 07/09/2020 Aikam is doing well from his Crohn's disease standpoint.  His last dose of Remicade was on 9/7 and tolerated it well.  He stopped azathioprine about 6 months ago.  He denies any GI symptoms, reports having 2-3 formed bowel movements daily.  He continues to gain weight.  He is currently working on 2 jobs.  He is divorced, active with another male partner.  He does not have any concerns today.  His most recent labs from this month including CBC and CMP are normal.   Crohn's disease classification:  Age: 69 to 56 Location:colonic  Behavior: non stricturing, penetrating  Perianal: Yes  IBD diagnosis: Patient is diagnosed with Crohn's disease when he was 39 years old, his symptoms were rectal bleeding, rectal pain, bloody diarrhea, rectal discharge, weight loss  Disease course: Right after diagnosis, patient was started on Remicade monotherapy.  Due to his insurance issues, he has been on and off Remicade to date.  The longest duration of Remicade was for 2 years without interruption.  His last Remicade dose was about 1 year ago.  He was receiving every 12 weeks when he was initially diagnosed and moved every 8 weeks.  He reports having breakthrough symptoms with 8 weeks interval on Remicade.  Patient was seeing Dr. Alessandra Bevels with [facility] gastroenterology as well as Dr. Collene Mares in [place].  He lost insurance, therefore  lost to follow-up.  Currently, he has insurance to his wife and wanted to see an IBD specialist in [place].  Patient reports that he was partially responding to Remicade.  He was receiving Flagyl as needed for rectal discharge which helps.  He does have severe rectal discomfort from external hemorrhoids which bleed from time to time.  His perianal skin is always sore and sometimes have to wear depends when discharge  is worse.  He also has leakage of the stool.  He has been limiting his p.o. intake due to rectal urgency, diarrhea.  He has 4-5 bowel movements daily.  He lost about 20 pounds within last 1 year.  He does have normocytic anemia.  Patient is started on Flagyl by his PCP.  05/2019:  continued antibiotics with Cipro and metronidazole for perianal Crohn's.  MRI pelvis confirmed perianal fistula with no underlying abscess.  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  Patient is evaluated by Dr. Vincenza Hews at [facility] IBD center due to evidence of dysplasia in the rectum recommended to maximize medical therapy.  After the basic work-up, patient is started on high-dose Remicade 10 mg/kg body weight, azathioprine 150 mg daily, short course of prednisone, antibiotics.  Patient stopped azathioprine about 6 months ago  Extra intestinal manifestations: None  IBD surgical history: None  Imaging:  MRE none MRI pelvis 06/08/2019 IMPRESSION: 1. Transsphincteric left perianal fistula arising at the 2 o'clock position and tracking deep to the skin along the left intergluteal fold. Signal void within the track as it extends to the skin may be related to gas within the track or the presence of a seton. 2. No rim enhancing, drainable perianal abscess.  CTE in 2011, normal SBFT none  Procedures: Colonoscopy 12/24/2016 at [facility] gastroenterology by Dr. 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 - The examined portion of the ileum was normal. Biopsied. - Simple Endoscopic Score for Crohn's Disease 16:, mucosal inflammatory changes secondary to Crohn's disease with colonic involvement. Biopsied. - Crohn's disease with colonic involvement. Inflammation was found in the distal rectum most severe, mild in descending colon, in the transverse colon, in the ascending colon and at the cecum.  DIAGNOSIS:  A. TERMINAL ILEUM; COLD BIOPSY:  - BENIGN ENTERIC MUCOSA WITH NORMAL VILLOUS ARCHITECTURE AND REACTIVE  FOLLICULAR LYMPHOID HYPERPLASIA.  - NEGATIVE FOR FEATURES OF ACTIVE ENTERITIS/ILEITIS.  - NEGATIVE FOR GRANULOMA, DYSPLASIA, AND MALIGNANCY.   B. COLON, RIGHT; COLD BIOPSY:  - CHRONIC COLITIS WITH MILD ACTIVITY (CRYPTITIS).  - SMALL SUBMUCOSAL NONCASEATING GRANULOMA.  - NEGATIVE FOR DYSPLASIA AND MALIGNANCY.   C. COLON, LEFT; COLD BIOPSY:  - CHRONIC COLITIS WITHOUT HISTOLOGIC EVIDENCE OF ACTIVE MUCOSAL  INFLAMMATION.  - SMALL SUBMUCOSAL NONCASEATING GRANULOMA.  - NEGATIVE FOR DYSPLASIA AND MALIGNANCY.   D. RECTUM; COLD BIOPSY:  - CHRONIC COLITIS  WITH SEVERE ACTIVITY (ULCERATION AND CRYPT ABSCESS).  - POLYPOID LOW GRADE DYSPLASIA WITH TUBULAR ARCHITECTURE.  - NEGATIVE FOR GRANULOMA, DYSPLASIA, AND MALIGNANCY.   Colonoscopy 10/13/2019 - Perianal skin tags found on perianal exam. - The examined portion of the ileum was normal. - Simple Endoscopic Score for Crohn's Disease: 4, mucosal inflammatory changes secondary to Crohn's disease with colonic involvement. Biopsied. - Remarkable endoscopic improvement to remicade  DIAGNOSIS:  A. RIGHT COLON; COLD BIOPSY:  - COLONIC MUCOSA WITH INTACT  CRYPT ARCHITECTURE.  - NEGATIVE FOR ACTIVE COLITIS, DYSPLASIA, AND MALIGNANCY.   B. LEFT COLON; COLD BIOPSY:  - COLONIC MUCOSA WITH INTACT CRYPT ARCHITECTURE.  - PANETH CELL METAPLASIA, MULTIFOCAL.  - NEGATIVE FOR ACTIVE INFLAMMATION, DYSPLASIA, AND MALIGNANCY.  - FEATURES ARE CONSISTENT WITH QUIESCENT COLITIS.   C. RECTUM; COLD BIOPSY:  - CHRONIC PROCTITIS WITH MINIMAL ACTIVE INFLAMMATION.  - NEGATIVE FOR DYSPLASIA AND MALIGNANCY.   D. RECTUM, DISTAL; COLD BIOPSY:  - CHRONIC PROCTITIS WITH MILD ACTIVE INFLAMMATION.  - NEGATIVE FOR DYSPLASIA AND MALIGNANCY.   Comment:  There is proctitis with crypt distortion and plasma cell infiltrates.  Significant neutrophilic infiltrates are present only in the distal  rectum, with neutrophils present in the lamina propria and surface  epithelium. All of the samples are negative for viral cytopathic  effects, granulomas, and dysplasia. The previous pathology report and  slides were reviewed ([id number], 06/15/2019). In comparison, the  current rectal samples show greatly reduced inflammation, without  evidence of epithelial atypia.   Upper Endoscopy none  VCE none  IBD medications:  Steroids: Prednisone responsive, tolerating well 5-ASA: None  immunomodulators: AZA started in 06/2019, methotrexate, nave TPMT status homozygous Biologics: Anti TNFs: Remicade monotherapy, started at age 62, previously every 12 weeks then moved to every 8 weeks And infliximab antibodies undetected on 06/09/2019 Restarted Remicade, first induction dose was 5 mg/kg, increase to 10 mg/kg from second induction dose Anti Integrins: Nave Ustekinumab: Nave Tofactinib: Nave Clinical trial: None  NSAIDs: None  Antiplts/Anticoagulants/Anti thrombotics: None  His cousin has Crohn's disease Denies family history of GI malignancy  Past Medical History:  Diagnosis Date  . Crohn's disease ([place])   . Inguinal hernia 01/2014   left  . Seasonal allergies      Past Surgical History:  Procedure Laterality Date  . COLONOSCOPY     x 2-3  . COLONOSCOPY WITH PROPOFOL N/A 06/15/2019   Procedure: COLONOSCOPY WITH PROPOFOL;  Surgeon: Lin Landsman, MD;  [facility] ENDOSCOPY;  Service: Gastroenterology;  Laterality: N/A;  . COLONOSCOPY WITH PROPOFOL N/A 10/13/2019   Procedure: COLONOSCOPY WITH PROPOFOL;  Surgeon: Lin Landsman, MD;  Location: [facility] ENDOSCOPY;  Service: Gastroenterology;  Laterality: N/A;  . HERNIA REPAIR Right 2010  . INGUINAL HERNIA REPAIR Left 02/28/2014   Procedure: HERNIA REPAIR INGUINAL ADULT;  Surgeon: Adin Hector, MD;  Location: [place];  Service: General;  Laterality: Left;  . INSERTION OF MESH Left 02/28/2014   Procedure: INSERTION OF MESH;  Surgeon: Adin Hector, MD;  Location: [place];  Service: General;  Laterality: Left;    Current Outpatient Medications:  .  inFLIXimab (REMICADE IV), Inject 700 mg PE into the vein every 8 (eight) weeks., Disp: , Rfl:  .  Vitamin D, Ergocalciferol, (DRISDOL) 1.25 MG (50000 UT) CAPS capsule, Take 1 capsule (50,000 Units total) by mouth 2 (two) times a week., Disp: 24 capsule, Rfl: 1    Family History  Problem Relation Age of Onset  . Cancer  Mother   . Hypertension Mother   . Diabetes Father      Social History   Tobacco Use  . Smoking status: Never Smoker  . Smokeless tobacco: Never Used  Vaping Use  . Vaping Use: Never used  Substance Use Topics  . Alcohol use: Yes    Alcohol/week: 3.0 standard drinks    Types: 3 Standard drinks or equivalent per week  . Drug use: Yes    Types: Marijuana    Comment: 10/12/19    Allergies as of 07/09/2020  . (No Known Allergies)    Review of Systems:    All systems reviewed and negative except where noted in HPI.   Physical Exam:  BP (!) 150/82 (BP Location: Left Arm, Patient Position: Sitting, Cuff Size: Normal)   Pulse 96   Temp 98.4 F (36.9 C) (Oral)   Ht 6' 0.4"  (1.839 m)   Wt 180 lb 2 oz (81.7 kg)   BMI 24.16 kg/m  No LMP for male patient.  General:   Alert, moderately built, moderately nourished, pleasant and cooperative in NAD, in good spirits Head:  Normocephalic and atraumatic. Eyes:  Sclera clear, no icterus.   Conjunctiva pink. Ears:  Normal auditory acuity. Nose:  No deformity, discharge, or lesions. Mouth:  No deformity or lesions,oropharynx pink & moist. Neck:  Supple; no masses or thyromegaly. Lungs:  Respirations even and unlabored.  Clear throughout to auscultation.   No wheezes, crackles, or rhonchi. No acute distress. Heart:  Regular rate and rhythm; no murmurs, clicks, rubs, or gallops. Abdomen:  Normal bowel sounds. Soft, non-tender and non-distended without masses, hepatosplenomegaly or hernias noted.  No guarding or rebound tenderness.   Rectal: Not performed Msk:  Symmetrical without gross deformities. Good, equal movement & strength bilaterally. Pulses:  Normal pulses noted. Extremities:  No clubbing or edema.  No cyanosis. Neurologic:  Alert and oriented x3;  grossly normal neurologically. Skin:  Intact without significant lesions or rashes. No jaundice. Psych:  Alert and cooperative. Normal mood and affect.  Imaging Studies: Reviewed  Assessment and Plan:   NICKEY KLOEPFER is a 39 y.o. male with Crohn's colitis, worse in rectum as well as perianal Crohn'n diagnosed at age 41, previously on Remicade monotherapy, exacerbation of Crohn's secondary to discontinuation of medication.  Colonoscopy confirmed mild colitis, severe proctitis with dysplasia in the rectum, started on high-dose Remicade, azathioprine, antibiotics and prednisone taper in 07/2019.  Currently on high-dose Remicade monotherapy, in clinical remission and significant endoscopy healing  Crohn's colitis Continue high-dose Remicade Labs every 3 months Recommend flexible sigmoidoscopy to assess rectum given history of low-grade dysplasia Check  infliximab antibodies and trough levels before next dose Check fecal calprotectin levels  Perianal fistula: Resolved Previously treated with metronidazole and ciprofloxacin  IBD Health Maintenance  1.TB status: Was indeterminate while on prednisone on 06/09/2019, repeat returned negative on 06/15/2019, updated QuantiFERON gold test 2. Anemia: Resolved, normal iron, B12 and folate levels 3.Immunizations: Twinrix first dose on 08/14/2019, second dose 11/28/19, third dose with ministered today influenza 06/29/2019, prevnar 08/14/2019, pneumovax to be administered today, Varicella unknown status, Zoster recommend Shingrix vaccine by PCP 4.Cancer screening I) Colon cancer/dysplasia surveillance: Low-grade dysplasia in the rectum, repeat colonoscopy with biopsies in 09/2019 with no evidence of dysplasia.  Will perform flexible sigmoidoscopy in 6 months II) Cervical cancer: n/a III) Skin cancer - counseled about annual skin exam by dermatology and skin protection in summer using sun screen SPF > 50, clothing 5.Bone health Vitamin D status:  29.1, mildly low, recommend calcium plus vitamin D daily Bone density testing: Not done 5. Labs: every 3 months 6. Smoking: Ex-smoker, smokes marijuana regularly 7. NSAIDs and Antibiotics use: none   Follow up in 6 months   Cephas Darby, MD

## 2020-07-09 NOTE — Telephone Encounter (Signed)
F[facility]ed [facility] orders to [facility]

## 2020-07-09 NOTE — Telephone Encounter (Signed)
Called Sharrie Rothman and emailed Sharrie Rothman about the weight gain for patient does. Informed her patient went from 173.8 in March to 180lbs1oz today. Also informed Sharrie Rothman we needed antibodies test done before next infusion. She said to just fax the lab orders over and she would make sure it gets done

## 2020-07-10 ENCOUNTER — Other Ambulatory Visit: Payer: BC Managed Care – PPO

## 2020-07-10 LAB — CBC
Hematocrit: 41.1 % (ref 37.5–51.0)
Hemoglobin: 14.1 g/dL (ref 13.0–17.7)
MCH: 31.5 pg (ref 26.6–33.0)
MCHC: 34.3 g/dL (ref 31.5–35.7)
MCV: 92 fL (ref 79–97)
Platelets: 218 10*3/uL (ref 150–450)
RBC: 4.47 x10E6/uL (ref 4.14–5.80)
RDW: 13.1 % (ref 11.6–15.4)
WBC: 6.3 10*3/uL (ref 3.4–10.8)

## 2020-07-10 LAB — COMPREHENSIVE METABOLIC PANEL
ALT: 16 IU/L (ref 0–44)
AST: 17 IU/L (ref 0–40)
Albumin/Globulin Ratio: 1.4 (ref 1.2–2.2)
Albumin: 4.4 g/dL (ref 4.0–5.0)
Alkaline Phosphatase: 76 IU/L (ref 44–121)
BUN/Creatinine Ratio: 17 (ref 9–20)
BUN: 13 mg/dL (ref 6–20)
Bilirubin Total: 0.6 mg/dL (ref 0.0–1.2)
CO2: 28 mmol/L (ref 20–29)
Calcium: 9.5 mg/dL (ref 8.7–10.2)
Chloride: 100 mmol/L (ref 96–106)
Creatinine, Ser: 0.75 mg/dL — ABNORMAL LOW (ref 0.76–1.27)
GFR calc Af Amer: 135 mL/min/{1.73_m2} (ref >59)
GFR calc non Af Amer: 117 mL/min/{1.73_m2} (ref >59)
Globulin, Total: 3.2 g/dL (ref 1.5–4.5)
Glucose: 159 mg/dL — ABNORMAL HIGH (ref 65–99)
Potassium: 3.8 mmol/L (ref 3.5–5.2)
Sodium: 141 mmol/L (ref 134–144)
Total Protein: 7.6 g/dL (ref 6.0–8.5)

## 2020-07-11 LAB — NOVEL CORONAVIRUS, NAA

## 2020-07-19 ENCOUNTER — Other Ambulatory Visit: Payer: BC Managed Care – PPO | Attending: Gastroenterology

## 2020-07-22 ENCOUNTER — Telehealth: Payer: Self-pay

## 2020-07-22 NOTE — Telephone Encounter (Signed)
-----   Message from Storm Frisk, [place] sent at 07/22/2020 12:41 PM EDT ----- Regarding: Patient needs prep Gregory Meyer,  Patient called and left a voicemail saying he did not receive his prep and he also went to have his covid test done somewhere else.  He can be reached at [phone].    Thanks, Jovon

## 2020-07-22 NOTE — Telephone Encounter (Signed)
Called and left a message for call back. Left a detail message stating that patient could not have covid test at another location that [facility]. Informed on message that the instructions on what he needed otc was on his instructions sheet given to him at his office visit.

## 2020-07-23 ENCOUNTER — Encounter: Admission: RE | Payer: Self-pay | Source: Home / Self Care

## 2020-07-23 ENCOUNTER — Ambulatory Visit
Admission: RE | Admit: 2020-07-23 | Payer: BC Managed Care – PPO | Source: Home / Self Care | Admitting: Gastroenterology

## 2020-07-23 ENCOUNTER — Other Ambulatory Visit: Payer: Self-pay

## 2020-07-23 DIAGNOSIS — K50111 Crohn's disease of large intestine with rectal bleeding: Secondary | ICD-10-CM

## 2020-07-23 SURGERY — SIGMOIDOSCOPY, FLEXIBLE
Anesthesia: General

## 2020-07-23 NOTE — Telephone Encounter (Signed)
Patient rescheduled procedure to 08/06/2020. Patient states that he will go for COVID test on 08/02/2020. Went over instructions with patient and sent it to his mychart

## 2020-08-01 ENCOUNTER — Other Ambulatory Visit: Payer: Self-pay

## 2020-08-01 ENCOUNTER — Other Ambulatory Visit
Admission: RE | Admit: 2020-08-01 | Discharge: 2020-08-01 | Disposition: A | Discharge status: Home / Self Care | Payer: BC Managed Care – PPO | Source: Ambulatory Visit | Attending: Gastroenterology | Admitting: Gastroenterology

## 2020-08-01 DIAGNOSIS — Z01812 Encounter for preprocedural laboratory examination: Secondary | ICD-10-CM | POA: Insufficient documentation

## 2020-08-01 DIAGNOSIS — Z20822 Contact with and (suspected) exposure to covid-19: Secondary | ICD-10-CM | POA: Insufficient documentation

## 2020-08-01 LAB — SARS CORONAVIRUS 2 (TAT 6-24 HRS): SARS Coronavirus 2: NEGATIVE

## 2020-08-02 ENCOUNTER — Other Ambulatory Visit: Payer: BC Managed Care – PPO

## 2020-08-06 ENCOUNTER — Other Ambulatory Visit: Payer: Self-pay

## 2020-08-06 ENCOUNTER — Encounter: Payer: Self-pay | Admitting: Gastroenterology

## 2020-08-06 ENCOUNTER — Ambulatory Visit
Admission: RE | Admit: 2020-08-06 | Discharge: 2020-08-06 | Disposition: A | Discharge status: Home / Self Care | Payer: BC Managed Care – PPO | Attending: Gastroenterology | Admitting: Gastroenterology

## 2020-08-06 ENCOUNTER — Ambulatory Visit: Payer: BC Managed Care – PPO | Admitting: Anesthesiology

## 2020-08-06 ENCOUNTER — Encounter
Admission: RE | Disposition: A | Discharge status: Home / Self Care | Payer: Self-pay | Source: Home / Self Care | Attending: Gastroenterology

## 2020-08-06 DIAGNOSIS — Z8719 Personal history of other diseases of the digestive system: Secondary | ICD-10-CM | POA: Insufficient documentation

## 2020-08-06 DIAGNOSIS — Z833 Family history of diabetes mellitus: Secondary | ICD-10-CM | POA: Insufficient documentation

## 2020-08-06 DIAGNOSIS — Z8249 Family history of ischemic heart disease and other diseases of the circulatory system: Secondary | ICD-10-CM | POA: Diagnosis not present

## 2020-08-06 DIAGNOSIS — K50111 Crohn's disease of large intestine with rectal bleeding: Secondary | ICD-10-CM

## 2020-08-06 DIAGNOSIS — Z79899 Other long term (current) drug therapy: Secondary | ICD-10-CM | POA: Diagnosis not present

## 2020-08-06 DIAGNOSIS — K644 Residual hemorrhoidal skin tags: Secondary | ICD-10-CM | POA: Insufficient documentation

## 2020-08-06 DIAGNOSIS — K6289 Other specified diseases of anus and rectum: Secondary | ICD-10-CM | POA: Insufficient documentation

## 2020-08-06 DIAGNOSIS — K5090 Crohn's disease, unspecified, without complications: Secondary | ICD-10-CM | POA: Diagnosis present

## 2020-08-06 DIAGNOSIS — Z809 Family history of malignant neoplasm, unspecified: Secondary | ICD-10-CM | POA: Insufficient documentation

## 2020-08-06 DIAGNOSIS — K5010 Crohn's disease of large intestine without complications: Secondary | ICD-10-CM

## 2020-08-06 HISTORY — PX: FLEXIBLE SIGMOIDOSCOPY: SHX5431

## 2020-08-06 SURGERY — SIGMOIDOSCOPY, FLEXIBLE
Anesthesia: General

## 2020-08-06 MED ORDER — PROPOFOL 10 MG/ML IV BOLUS
INTRAVENOUS | Status: DC | PRN
  Administered 2020-08-06: 40 mg via INTRAVENOUS

## 2020-08-06 MED ORDER — PROPOFOL 10 MG/ML IV BOLUS: INTRAVENOUS | Status: DC | PRN

## 2020-08-06 MED ORDER — SODIUM CHLORIDE 0.9 % IV SOLN
INTRAVENOUS | Status: DC
  Administered 2020-08-06: 1000 mL via INTRAVENOUS

## 2020-08-06 MED ORDER — PROPOFOL 500 MG/50ML IV EMUL
INTRAVENOUS | Status: DC | PRN
  Administered 2020-08-06: 175 ug/kg/min via INTRAVENOUS

## 2020-08-06 MED ORDER — MIDAZOLAM HCL 2 MG/2ML IJ SOLN
INTRAMUSCULAR | Status: DC | PRN
  Administered 2020-08-06: 2 mg via INTRAVENOUS

## 2020-08-06 MED ORDER — LIDOCAINE HCL (CARDIAC) PF 100 MG/5ML IV SOSY
PREFILLED_SYRINGE | INTRAVENOUS | Status: DC | PRN
  Administered 2020-08-06: 100 mg via INTRAVENOUS

## 2020-08-06 MED ORDER — MIDAZOLAM HCL 2 MG/2ML IJ SOLN
INTRAMUSCULAR | Status: AC
  Filled 2020-08-06: qty 2

## 2020-08-06 NOTE — H&P (Signed)
Cephas Darby, MD [address]  [place]  [place], [place] [address]  Main: [phone]  Fax: [phone] Pager: [phone]  Primary Care Physician:  Minette Brine, FNP Primary Gastroenterologist:  Dr. Cephas Darby  Pre-Procedure History & Physical: HPI:  Gregory Meyer is a 39 y.o. male is here for an flexible sigmoidoscopy.   Past Medical History:  Diagnosis Date  . Crohn's disease ([employer])   . Inguinal hernia 01/2014   left  . Seasonal allergies     Past Surgical History:  Procedure Laterality Date  . COLONOSCOPY     x 2-3  . COLONOSCOPY WITH PROPOFOL N/A 06/15/2019   Procedure: COLONOSCOPY WITH PROPOFOL;  Surgeon: Lin Landsman, MD;  Location: [facility] ENDOSCOPY;  Service: Gastroenterology;  Laterality: N/A;  . COLONOSCOPY WITH PROPOFOL N/A 10/13/2019   Procedure: COLONOSCOPY WITH PROPOFOL;  Surgeon: Lin Landsman, MD;  Location: [facility] ENDOSCOPY;  Service: Gastroenterology;  Laterality: N/A;  . HERNIA REPAIR Right 2010  . INGUINAL HERNIA REPAIR Left 02/28/2014   Procedure: HERNIA REPAIR INGUINAL ADULT;  Surgeon: Adin Hector, MD;  Location: [place];  Service: General;  Laterality: Left;  . INSERTION OF MESH Left 02/28/2014   Procedure: INSERTION OF MESH;  Surgeon: Adin Hector, MD;  Location: [place];  Service: General;  Laterality: Left;    Prior to Admission medications   Medication Sig Start Date End Date Taking? Authorizing Provider  inFLIXimab (REMICADE IV) Inject 700 mg PE into the vein every 8 (eight) weeks.   Yes [provider]  Vitamin D, Ergocalciferol, (DRISDOL) 1.25 MG (50000 UT) CAPS capsule Take 1 capsule (50,000 Units total) by mouth 2 (two) times a week. 10/23/19  Yes Minette Brine, FNP    Allergies as of 07/23/2020  . (No Known Allergies)    Family History  Problem Relation Age of Onset  . Cancer Mother   . Hypertension Mother   . Diabetes Father     Social History    Socioeconomic History  . Marital status: Married    Spouse name: Not on file  . Number of children: Not on file  . Years of education: Not on file  . Highest education level: Not on file  Occupational History  . Not on file  Tobacco Use  . Smoking status: Never Smoker  . Smokeless tobacco: Never Used  Vaping Use  . Vaping Use: Never used  Substance and Sexual Activity  . Alcohol use: Yes    Alcohol/week: 3.0 standard drinks    Types: 3 Standard drinks or equivalent per week  . Drug use: Yes    Types: Marijuana    Comment: 10/12/19  . Sexual activity: Yes  Other Topics Concern  . Not on file  Social History Narrative  . Not on file   Social Determinants of Health   Financial Resource Strain:   . Difficulty of Paying Living Expenses: Not on file  Food Insecurity:   . Worried About Charity fundraiser in the Last Year: Not on file  . Ran Out of Food in the Last Year: Not on file  Transportation Needs:   . Lack of Transportation (Medical): Not on file  . Lack of Transportation (Non-Medical): Not on file  Physical Activity:   . Days of Exercise per Week: Not on file  . Minutes of Exercise per Session: Not on file  Stress:   . Feeling of Stress : Not on file  Social Connections:   .  Frequency of Communication with Friends and Family: Not on file  . Frequency of Social Gatherings with Friends and Family: Not on file  . Attends Religious Services: Not on file  . Active Member of Clubs or Organizations: Not on file  . Attends Archivist Meetings: Not on file  . Marital Status: Not on file  Intimate Partner Violence:   . Fear of Current or Ex-Partner: Not on file  . Emotionally Abused: Not on file  . Physically Abused: Not on file  . Sexually Abused: Not on file    Review of Systems: See HPI, otherwise negative ROS  Physical Exam: BP (!) 143/95   Pulse 68   Temp 97.8 F (36.6 C) (Temporal)   Resp 18   Ht 6' (1.829 m)   Wt 81.8 kg   SpO2 100%    BMI 24.46 kg/m  General:   Alert,  pleasant and cooperative in NAD Head:  Normocephalic and atraumatic. Neck:  Supple; no masses or thyromegaly. Lungs:  Clear throughout to auscultation.    Heart:  Regular rate and rhythm. Abdomen:  Soft, nontender and nondistended. Normal bowel sounds, without guarding, and without rebound.   Neurologic:  Alert and  oriented x4;  grossly normal neurologically.  Impression/Plan: Gregory Meyer is here for an flexible sigmoidoscopy to be performed for follow up of crohn's disease  Risks, benefits, limitations, and alternatives regarding  flexible sigmoidoscopy have been reviewed with the patient.  Questions have been answered.  All parties agreeable.   Sherri Sear, MD  08/06/2020, 10:24 AM

## 2020-08-06 NOTE — Anesthesia Preprocedure Evaluation (Signed)
Anesthesia Evaluation  Patient identified by MRN, date of birth, ID band Patient awake    Reviewed: Allergy & Precautions, H&P , NPO status , Patient's Chart, lab work & pertinent test results  History of Anesthesia Complications Negative for: history of anesthetic complications  Airway Mallampati: I  TM Distance: >3 FB Neck ROM: Full    Dental no notable dental hx. (+) Teeth Intact, Dental Advisory Given   Pulmonary neg pulmonary ROS, neg sleep apnea, neg COPD, Patient abstained from smoking.Not current smoker,    Pulmonary exam normal breath sounds clear to auscultation       Cardiovascular Exercise Tolerance: Good METS(-) hypertension(-) CAD and (-) Past MI negative cardio ROS  (-) dysrhythmias  Rhythm:Regular Rate:Normal - Systolic murmurs    Neuro/Psych negative neurological ROS  negative psych ROS   GI/Hepatic Neg liver ROS, neg GERD  ,crohn's   Endo/Other  negative endocrine ROSneg diabetes  Renal/GU negative Renal ROS  negative genitourinary   Musculoskeletal negative musculoskeletal ROS (+)   Abdominal   Peds negative pediatric ROS (+)  Hematology negative hematology ROS (+)   Anesthesia Other Findings Past Medical History: No date: Crohn's disease ([place]) 01/2014: Inguinal hernia     Comment:  left No date: Seasonal allergies  Reproductive/Obstetrics                             Anesthesia Physical  Anesthesia Plan  ASA: II  Anesthesia Plan: General   Post-op Pain Management:    Induction: Intravenous  PONV Risk Score and Plan: 2 and Propofol infusion, TIVA and Ondansetron  Airway Management Planned: Nasal Cannula  Additional Equipment: None  Intra-op Plan:   Post-operative Plan:   Informed Consent: I have reviewed the patients History and Physical, chart, labs and discussed the procedure including the risks, benefits and alternatives for the proposed  anesthesia with the patient or authorized representative who has indicated his/her understanding and acceptance.     Dental advisory given  Plan Discussed with: CRNA, Anesthesiologist and Surgeon  Anesthesia Plan Comments: (Discussed risks of anesthesia with patient, including possibility of difficulty with spontaneous ventilation under anesthesia necessitating airway intervention, PONV, and rare risks such as cardiac or respiratory or neurological events. Patient understands.)        Anesthesia Quick Evaluation

## 2020-08-06 NOTE — Transfer of Care (Signed)
Immediate Anesthesia Transfer of Care Note  Patient: Gregory Meyer  Procedure(s) Performed: FLEXIBLE SIGMOIDOSCOPY (N/A )  Patient Location: Endoscopy Unit  Anesthesia Type:General  Level of Consciousness: awake, drowsy and patient cooperative  Airway & Oxygen Therapy: Patient Spontanous Breathing  Post-op Assessment: Report given to RN and Post -op Vital signs reviewed and stable  Post vital signs: Reviewed and stable  Last Vitals:  Vitals Value Taken Time  BP 125/76 08/06/20 1055  Temp    Pulse 68 08/06/20 1059  Resp 16 08/06/20 1059  SpO2 100 % 08/06/20 1059  Vitals shown include unvalidated device data.  Last Pain:  Vitals:   08/06/20 0932  TempSrc: Temporal         Complications: No complications documented.

## 2020-08-06 NOTE — Op Note (Signed)
[facility]astroenter[facility] Patient Name: Gregory Meyer Procedure Date: 08/06/2020 10:28 AM MRN: [id number] Account #: [id number] Date of Birth: 1981-10-10 Admit Type: Outpatient Age: 39 Room: [facility] ENDO ROOM 1 Gender: Male Note Status: Finalized Procedure:             Flexible Sigmoidoscopy Indications:           Personal history of Crohn's disease Providers:             Lin Landsman MD, MD Medicines:             General Anesthesia Complications:         No immediate complications. Estimated blood loss:                         Minimal. Procedure:             Pre-Anesthesia Assessment:                        - Prior to the procedure, a History and Physical was                         performed, and patient medications and allergies were                         reviewed. The patient is competent. The risks and                         benefits of the procedure and the sedation options and                         risks were discussed with the patient. All questions                         were answered and informed consent was obtained.                         Patient identification and proposed procedure were                         verified by the physician, the nurse, the                         anesthesiologist, the anesthetist and the technician                         in the pre-procedure area in the procedure room in the                         endoscopy suite. Mental Status Examination: alert and                         oriented. Airway Examination: normal oropharyngeal                         airway and neck mobility. Respiratory Examination:                         clear to auscultation. CV Examination: normal.  Prophylactic Antibiotics: The patient does not require                         prophylactic antibiotics. Prior Anticoagulants: The                         patient has taken no previous anticoagulant or                          antiplatelet agents. ASA Grade Assessment: II - A                         patient with mild systemic disease. After reviewing                         the risks and benefits, the patient was deemed in                         satisfactory condition to undergo the procedure. The                         anesthesia plan was to use general anesthesia.                         Immediately prior to administration of medications,                         the patient was re-assessed for adequacy to receive                         sedatives. The heart rate, respiratory rate, oxygen                         saturations, blood pressure, adequacy of pulmonary                         ventilation, and response to care were monitored                         throughout the procedure. The physical status of the                         patient was re-assessed after the procedure.                        After obtaining informed consent, the scope was passed                         under direct vision. The Endoscope was introduced                         through the anus and advanced to the the splenic                         flexure. The flexible sigmoidoscopy was accomplished                         without difficulty. The patient tolerated the  procedure fairly well. The quality of the bowel                         preparation was good. Findings:      Skin tags were found on perianal exam.      The rectum, sigmoid colon, descending colon and splenic flexure appeared       normal. Biopsies were taken with a cold forceps for histology rectum.       Estimated blood loss was minimal.      Normal retroflexion in rectum Impression:            - Perianal skin tags found on perianal exam.                        - The rectum, sigmoid colon, descending colon and                         splenic flexure are normal. Biopsied. Recommendation:        - Discharge patient to home (with  escort).                        - Resume regular diet today.                        - Await pathology results.                        - Return to my office as previously scheduled.                        - Continue present medications. Procedure Code(s):     --- Professional ---                        [id number], Sigmoidoscopy, flexible; with biopsy, single or                         multiple Diagnosis Code(s):     --- Professional ---                        K64.4, Residual hemorrhoidal skin tags                        Z87.19, Personal history of other diseases of the                         digestive system CPT copyright 2019 [employer]. All rights reserved. The codes documented in this report are preliminary and upon coder review may  be revised to meet current compliance requirements. Dr. Ulyess Mort Lin Landsman MD, MD 08/06/2020 10:54:01 AM This report has been signed electronically. Number of Addenda: 0 Note Initiated On: 08/06/2020 10:28 AM Total Procedure Duration: 0 hours 6 minutes 5 seconds  Estimated Blood Loss:  Estimated blood loss was minimal.      [facility]

## 2020-08-06 NOTE — Anesthesia Postprocedure Evaluation (Signed)
Anesthesia Post Note  Patient: Gregory Meyer  Procedure(s) Performed: FLEXIBLE SIGMOIDOSCOPY (N/A )  Patient location during evaluation: Endoscopy Anesthesia Type: General Level of consciousness: awake and alert Pain management: pain level controlled Vital Signs Assessment: post-procedure vital signs reviewed and stable Respiratory status: spontaneous breathing, nonlabored ventilation, respiratory function stable and patient connected to nasal cannula oxygen Cardiovascular status: blood pressure returned to baseline and stable Postop Assessment: no apparent nausea or vomiting Anesthetic complications: no   No complications documented.   Last Vitals:  Vitals:   08/06/20 1110 08/06/20 1117  BP: 121/90 (!) 128/91  Pulse: 65 67  Resp: (!) 24 15  Temp:    SpO2: 100% 100%    Last Pain:  Vitals:   08/06/20 0932  TempSrc: Temporal                 Arita Miss

## 2020-08-07 ENCOUNTER — Encounter: Payer: Self-pay | Admitting: Gastroenterology

## 2020-08-07 LAB — SURGICAL PATHOLOGY

## 2020-09-25 IMAGING — MR MRI PELVIS WITHOUT AND WITH CONTRAST
4 of 8 series · 21 of 48 positions shown · IV contrast (gadavist)
Comparison: CT scan 05/13/2016

CLINICAL DATA: Crohn disease.  Question perianal fistula.

EXAM:
MRI PELVIS WITHOUT AND WITH CONTRAST
TECHNIQUE: Multiplanar multisequence MR imaging of the pelvis was performed
both before and after administration of intravenous contrast.
CONTRAST:  7 cc Gadavist

[Series 2: T2 · sagittal · 2.5mm · 0.81mm/px · 6 of 40 slices shown]
[im 1/40]
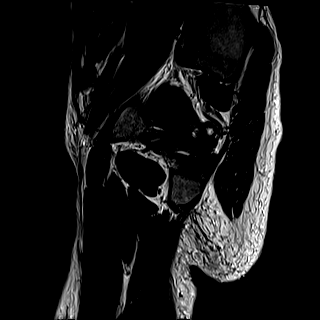
[im 8/40]
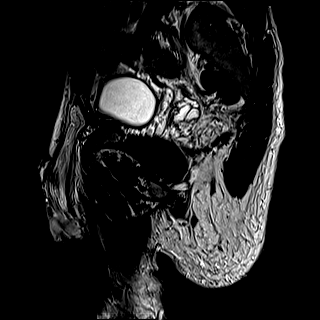
[im 16/40]
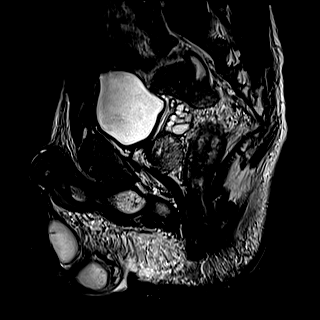
[im 24/40]
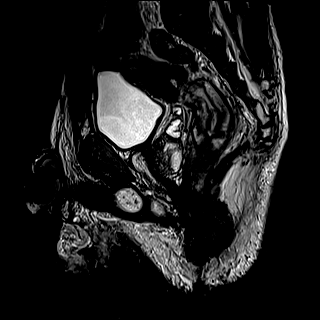
[im 32/40]
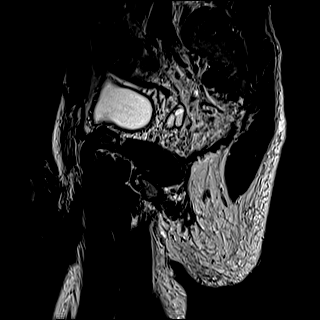
[im 40/40]
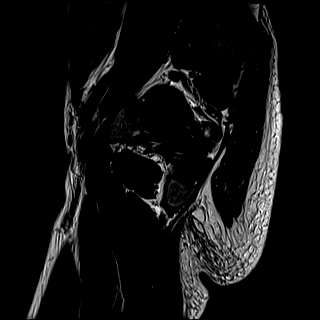

[Series 3: T2 fat-sat · sagittal · 2.5mm · 0.81mm/px · 6 of 40 slices shown (1 of 2)]
[im 1/40]
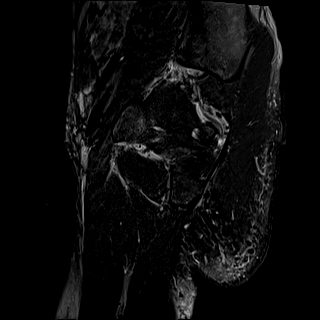
[im 8/40]
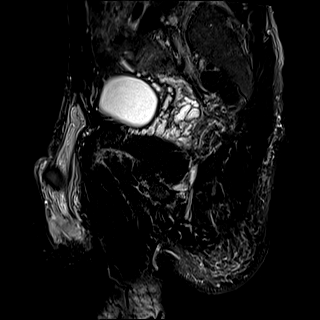
[im 16/40]
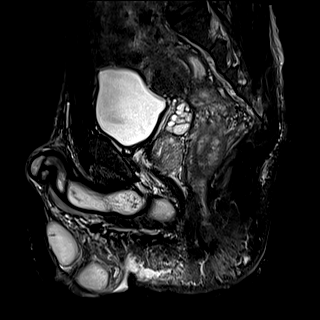
[im 24/40]
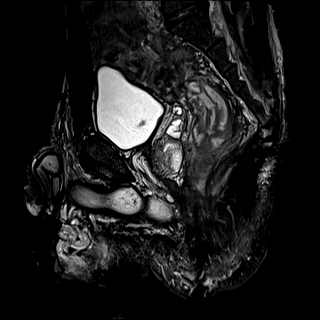
[im 32/40]
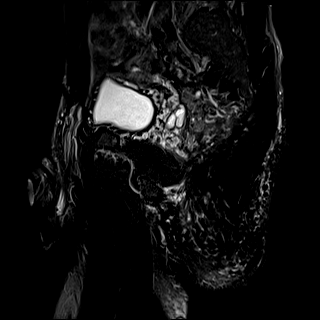
[im 40/40]
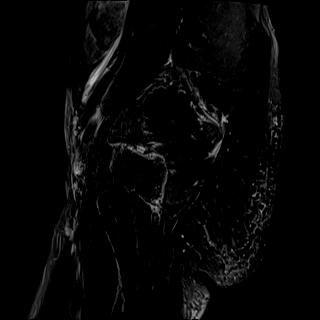

[Series 4: T1 · axial · 4.0mm · 0.29mm/px · z∈[-219,-23]mm · 6 of 45 slices shown]
[im 1/45]
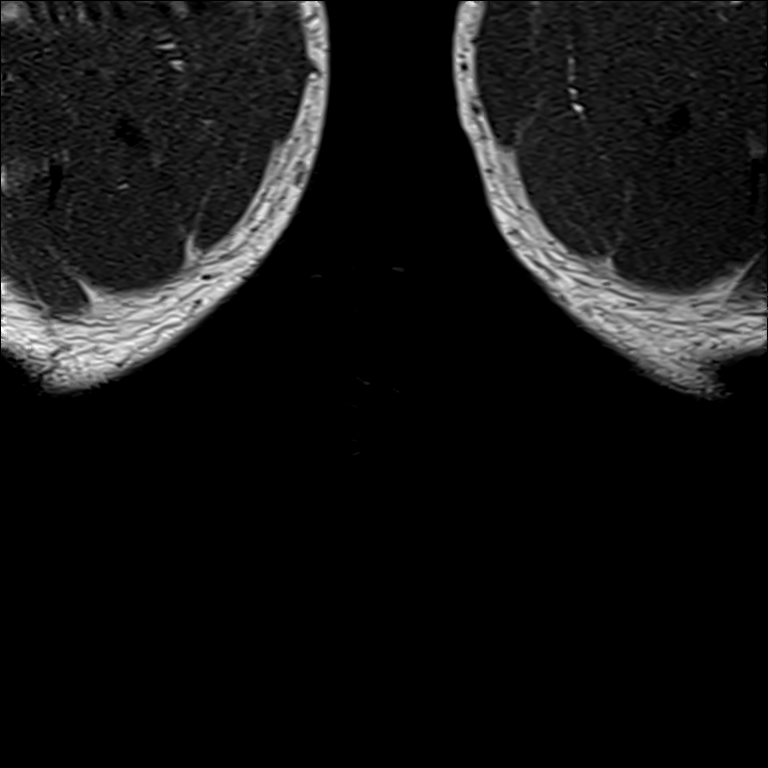
[im 9/45]
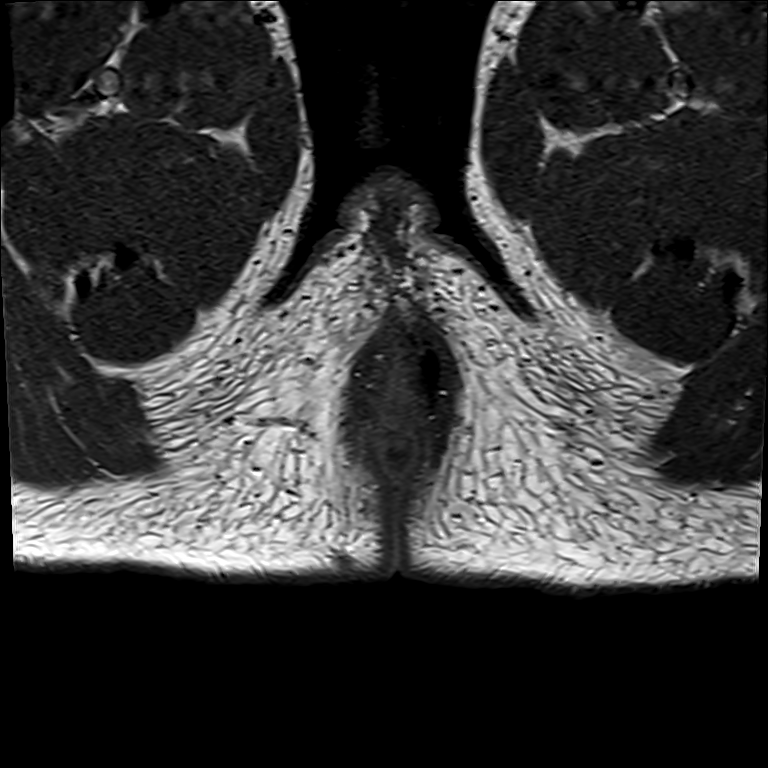
[im 18/45]
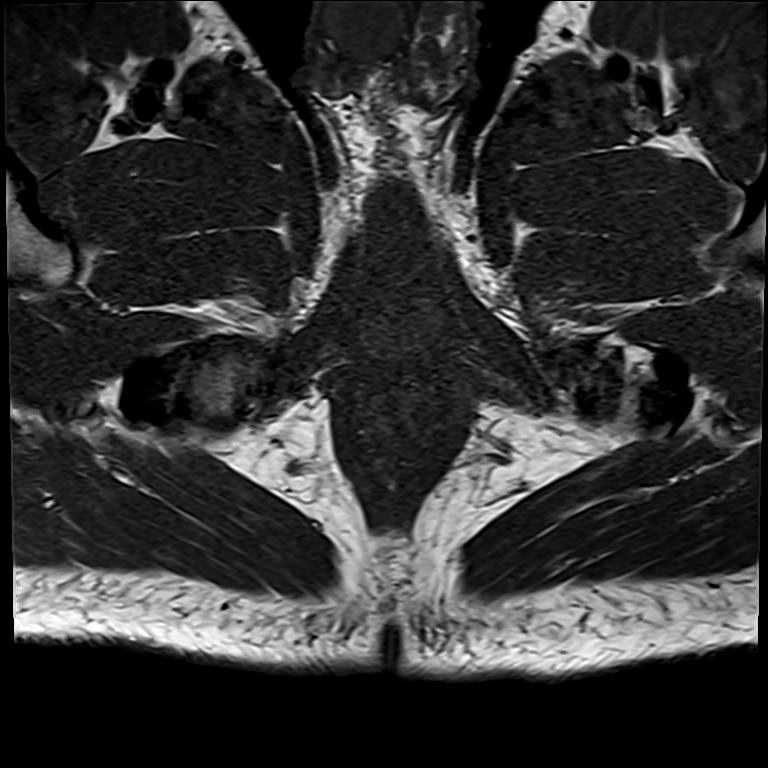
[im 27/45]
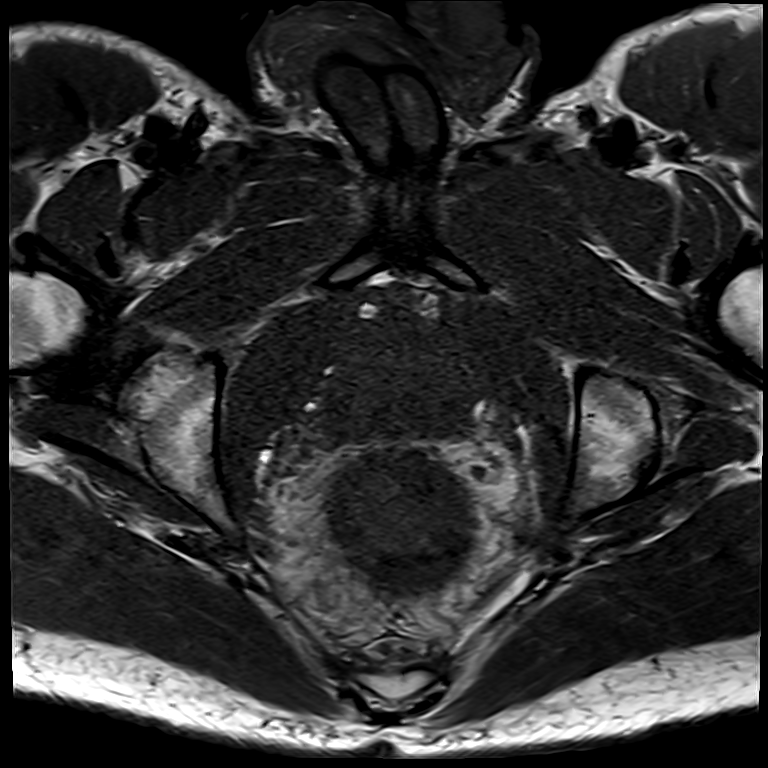
[im 36/45]
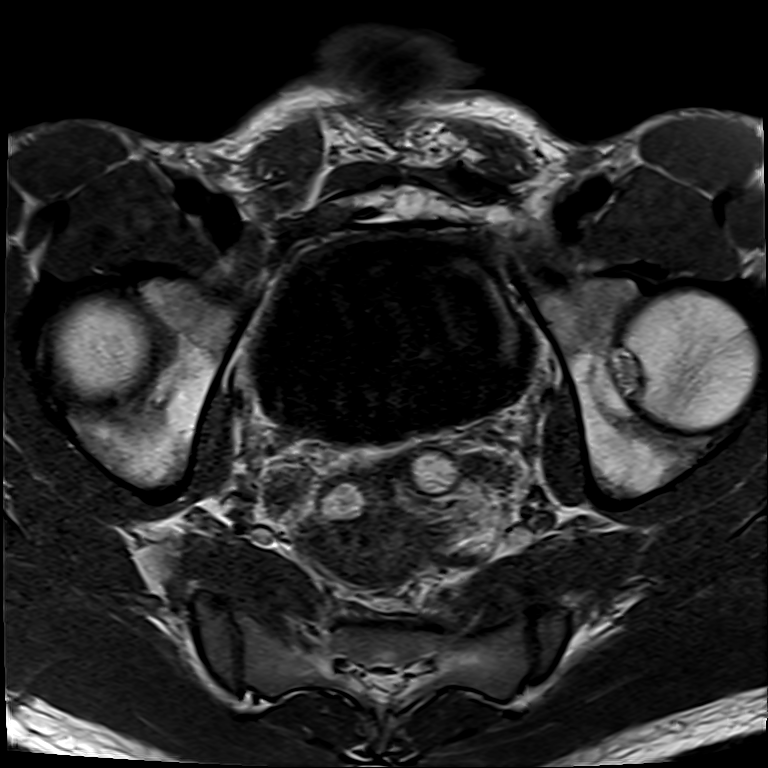
[im 45/45]
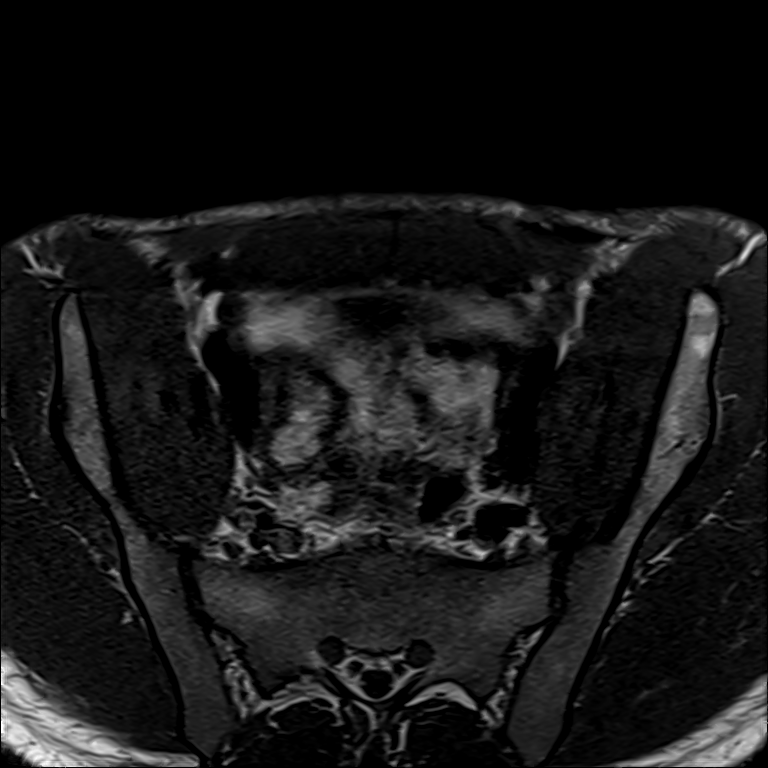

[Series 5: T2 fat-sat · coronal · 4.0mm · 0.75mm/px · 3 of 45 slices shown (2 of 2)]
[im 9/45]
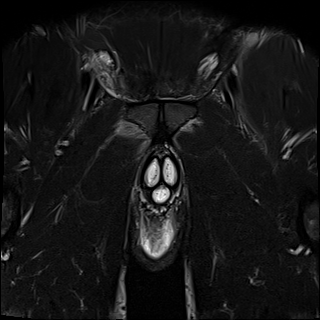
[im 27/45]
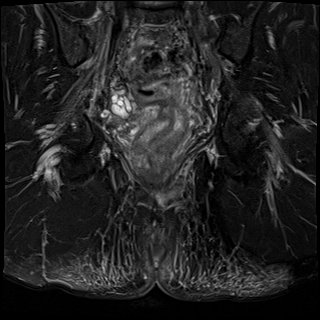
[im 45/45]
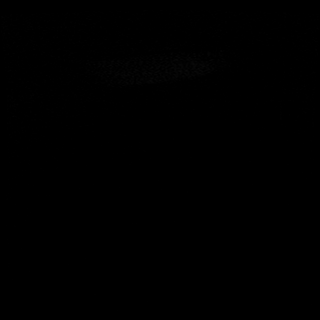

[21 of 48 positions shown; findings below may reference images not displayed]

FINDINGS: Urinary Tract:  Urinary bladder unremarkable.

Bowel: There appears to be mild circumferential thickening of the
rectal wall. At approximately the anorectal junction, there is
abnormal signal in the wall of the anus at approximately 2 o'clock
position (axial T2 image 29 of series 6). This area of abnormal
signal shows enhancement after IV contrast administration with
abnormal enhancement extending into the left ischial anal fat (image
29/series 10). Imaging features are compatible with a
transsphincteric perianal fistula. No definite abscess. Tracking
along the anus and then just deep to the skin along the left side of
the intergluteal fold is a track of low signal intensity on both T1
and T2 imaging which extends to the skin. There appears to be some
artifact associated with this finding at the level of the skin.

Vascular/Lymphatic: Unremarkable.

Reproductive: The prostate gland and seminal vesicles are
unremarkable.

Other:  No substantial intraperitoneal free fluid.

Musculoskeletal: No abnormal marrow enhancement within the
visualized bony anatomy.
IMPRESSION: 1. Transsphincteric left perianal fistula arising at the 2 o'clock
position and tracking deep to the skin along the left intergluteal
fold. Signal void within the track as it extends to the skin may be
related to gas within the track or the presence of a seton.
2. No rim enhancing, drainable perianal abscess.

## 2020-10-22 ENCOUNTER — Encounter: Payer: BC Managed Care – PPO | Admitting: Nurse Practitioner

## 2020-12-17 ENCOUNTER — Telehealth: Payer: Self-pay

## 2020-12-17 NOTE — Telephone Encounter (Signed)
We received [facility] certification for patient Remicade. Patient is due for a follow up appointment. Called and left a message for call back sent mychart message

## 2020-12-25 ENCOUNTER — Telehealth: Payer: Self-pay

## 2020-12-25 NOTE — Telephone Encounter (Signed)
Sent mychart message informing patient

## 2020-12-25 NOTE — Telephone Encounter (Signed)
We received home health certification and plan of care forms for Remicade. Patient has not been seen since 07/09/2020. This is recertification  from 37/10/626 to 04/04/2021. Called and left a message informing patient she was due for a appointment and sent mychart message. Please advised if you would like to sign for make appointment first

## 2020-12-25 NOTE — Telephone Encounter (Signed)
Gregory Meyer  He is actually due for 6 months follow-up this month.  Also, can you make sure that CBC, LFTs are drawn with every other infusion  Let's wait for his response and hold off on renewing his Remicade for now Send him a message on my chart that we are going to hold off on renewing his medication until I see him for 6 months follow-up  RV

## 2021-01-02 ENCOUNTER — Telehealth: Payer: Self-pay | Admitting: Gastroenterology

## 2021-01-02 NOTE — Telephone Encounter (Signed)
Called back and they are wanting sign orders for this patient. Informed them that we could not send orders till he makes a appointment. I have called patient and sent mychart messages informing patient

## 2021-01-02 NOTE — Telephone Encounter (Signed)
Adonis Housekeeper called from [facility] asking for call back regarding this patient's inFLIXimab (REMICADE IV) [[phone]] .  Patient has 3 pm appointment today.

## 2021-02-19 ENCOUNTER — Telehealth: Payer: Self-pay

## 2021-02-19 MED ORDER — INFLIXIMAB 100 MG IV SOLR: 700.0000 mg | INTRAVENOUS | 0 refills | Status: DC

## 2021-02-19 NOTE — Telephone Encounter (Addendum)
Last office visit that was seen was 07/09/2020

## 2021-02-19 NOTE — Telephone Encounter (Signed)
Sent medication to [facility]. I also emailed Sharrie Rothman with [facility] and informed her of this information

## 2021-02-19 NOTE — Telephone Encounter (Signed)
Patient has Crohn's disease and needs a refill on Remicade. Called to make an appt but can't be seen until 03/19/21. Wants to know if the provider is willing to fill med request? Crohn's appt is on 02/27/21

## 2021-02-19 NOTE — Telephone Encounter (Signed)
Ok, [place] and renew. I will see him for follow up in may   Thanks RV

## 2021-03-03 ENCOUNTER — Encounter: Payer: Self-pay | Admitting: Gastroenterology

## 2021-03-18 ENCOUNTER — Other Ambulatory Visit: Payer: Self-pay

## 2021-03-19 ENCOUNTER — Encounter: Payer: Self-pay | Admitting: Gastroenterology

## 2021-03-19 ENCOUNTER — Other Ambulatory Visit: Payer: Self-pay

## 2021-03-19 ENCOUNTER — Ambulatory Visit: Payer: BC Managed Care – PPO | Admitting: Gastroenterology

## 2021-03-19 VITALS — BP 149/94 | HR 78 | Temp 98.3°F | Ht 72.4 in | Wt 183.4 lb

## 2021-03-19 DIAGNOSIS — K50111 Crohn's disease of large intestine with rectal bleeding: Secondary | ICD-10-CM | POA: Diagnosis not present

## 2021-03-19 MED ORDER — SHINGRIX 50 MCG/0.5ML IM SUSR: 0.5000 mL | Freq: Once | INTRAMUSCULAR | 0 refills | Status: AC

## 2021-03-19 MED ORDER — INFLIXIMAB 100 MG IV SOLR: 700.0000 mg | INTRAVENOUS | 3 refills | Status: DC

## 2021-03-19 NOTE — Progress Notes (Signed)
Gregory Darby, MD [address]  [place], [place] [address]  Main: [phone]  Fax: [phone]    Gastroenterology Consultation  Referring Provider:     Minette Brine, [place] Primary Care Physician:  Minette Brine, FNP Primary Gastroenterologist:  Dr. Cephas Meyer Reason for Consultation:     Crohn's disease of the colon, perianal Crohn's        HPI:   Gregory Meyer is a 40 y.o. male referred by Dr. Minette Brine, [place]  for consultation & management of Crohn's disease of the colon, perianal Crohn's  Follow-up visit 08/14/2019 Patient received 2 induction doses of Remicade so far.  Due for third induction dose Friday this week.  Patient reports tolerating the medication very well without any side effects.  He is currently taking ciprofloxacin once a day, metronidazole twice daily.  Decrease prednisone to 10 mg daily for last 2 weeks.  He continues to take azathioprine 150 mg daily.  With regards to his symptoms, he reports feeling 50% better.  Less urgency, reports having bowel movements about 3 times in the morning, 2 times in the evening.  Appetite is intact, able to eat good 3 meals a day, energy levels are better.  Denies any blood in the stools, no evidence of rectal pain, fistula drainage.  He gained about 12 pounds in last 2 months.  Follow-up visit 12/26/19: Mr. Gregory Meyer has been doing remarkably well high-dose Remicade along with azathioprine 100 mg daily.  He underwent repeat colonoscopy in 07/2019 which revealed significant endoscopic improvement with mild inflammation in the rectum only.  I also performed chromoendoscopy with no evidence of any abdominal lesions.  The pathology did not reveal any evidence of dysplasia, showed only chronic mild active proctitis Clinically, he reports having formed bowel movements and sometimes hard.  He denies any rectal pain or drainage from fistulous.  He continues to gain weight.  Continues to work. He is recently  separated from his wife but adjusting well.  Denies any stress. He just changed his insurance, waiting for his next Remicade dose to be approved  Follow-up visit 07/09/2020 Gregory Meyer is doing well from his Crohn's disease standpoint.  His last dose of Remicade was on 9/7 and tolerated it well.  He stopped azathioprine about 6 months ago.  He denies any GI symptoms, reports having 2-3 formed bowel movements daily.  He continues to gain weight.  He is currently working on 2 jobs.  He is divorced, active with another male partner.  He does not have any concerns today.  His most recent labs from this month including CBC and CMP are normal.  Follow-up visit 03/19/2021 Patient is here for follow-up of Crohn's disease.  He is doing very well with regards to his Crohn's, denies any GI symptoms today.  His last dose of Remicade was on 5/6, revealed normal CBC, CMP.  He is currently on Remicade every 8 weeks and tolerating it well.  He gained 2 more pounds.  Reports having good appetite, has 2-3 soft bowel movements daily.  He denies any flareup of perianal Crohn's.  He continues to work at [employer], sometimes had to work overtime and is enjoying his job.   Crohn's disease classification:  Age: 52 to 38 Location:colonic  Behavior: non stricturing, penetrating  Perianal: Yes  IBD diagnosis: Patient is diagnosed with Crohn's disease when he was 40 years old, his symptoms were rectal bleeding, rectal pain, bloody diarrhea, rectal discharge, weight loss  Disease course:  Right after diagnosis, patient was started on Remicade monotherapy.  Due to his insurance issues, he has been on and off Remicade to date.  The longest duration of Remicade was for 2 years without interruption.  His last Remicade dose was about 1 year ago.  He was receiving every 12 weeks when he was initially diagnosed and moved every 8 weeks.  He reports having breakthrough symptoms with 8 weeks interval on Remicade.  Patient was seeing Dr.  Alessandra Bevels with [facility] gastroenterology as well as Dr. Collene Mares in [place].  He lost insurance, therefore lost to follow-up.  Currently, he has insurance to his wife and wanted to see an IBD specialist in [place].  Patient reports that he was partially responding to Remicade.  He was receiving Flagyl as needed for rectal discharge which helps.  He does have severe rectal discomfort from external hemorrhoids which bleed from time to time.  His perianal skin is always sore and sometimes have to wear depends when discharge is worse.  He also has leakage of the stool.  He has been limiting his p.o. intake due to rectal urgency, diarrhea.  He has 4-5 bowel movements daily.  He lost about 20 pounds within last 1 year.  He does have normocytic anemia.  Patient is started on Flagyl by his PCP.  05/2019:  continued antibiotics with Cipro and metronidazole for perianal Crohn's.  MRI pelvis confirmed perianal fistula with no underlying abscess.  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  Patient is evaluated by Dr. Vincenza Hews at [facility] IBD center due to evidence of dysplasia in the rectum recommended to maximize medical therapy.  After the basic work-up, patient is started on high-dose Remicade 10 mg/kg body weight, azathioprine 150 mg daily, short course of prednisone, antibiotics.  Patient stopped azathioprine about 6 months ago  Extra intestinal manifestations: None  IBD surgical history: None  Imaging:  MRE none MRI pelvis 06/08/2019 IMPRESSION: 1. Transsphincteric left perianal fistula arising at the 2 o'clock position and tracking deep to the skin along the left intergluteal fold. Signal void within the track as it extends to the skin may be related to gas within the track or the  presence of a seton. 2. No rim enhancing, drainable perianal abscess.  CTE in 2011, normal SBFT none  Procedures: Colonoscopy 12/24/2016 at [facility] gastroenterology by Dr. 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 - The examined portion of the ileum was normal. Biopsied. - Simple Endoscopic Score for Crohn's Disease 16:, mucosal inflammatory changes secondary to Crohn's disease with colonic involvement. Biopsied. - Crohn's disease with colonic involvement. Inflammation was found in the distal rectum most severe, mild in descending colon, in the transverse colon, in the ascending colon and at the cecum.  DIAGNOSIS:  A. TERMINAL ILEUM; COLD BIOPSY:  - BENIGN ENTERIC MUCOSA  WITH NORMAL VILLOUS ARCHITECTURE AND REACTIVE  FOLLICULAR LYMPHOID HYPERPLASIA.  - NEGATIVE FOR FEATURES OF ACTIVE ENTERITIS/ILEITIS.  - NEGATIVE FOR GRANULOMA, DYSPLASIA, AND MALIGNANCY.   B. COLON, RIGHT; COLD BIOPSY:  - CHRONIC COLITIS WITH MILD ACTIVITY (CRYPTITIS).  - SMALL SUBMUCOSAL NONCASEATING GRANULOMA.  - NEGATIVE FOR DYSPLASIA AND MALIGNANCY.   C. COLON, LEFT; COLD BIOPSY:  - CHRONIC COLITIS WITHOUT HISTOLOGIC EVIDENCE OF ACTIVE MUCOSAL  INFLAMMATION.  - SMALL SUBMUCOSAL NONCASEATING GRANULOMA.  - NEGATIVE FOR DYSPLASIA AND MALIGNANCY.   D. RECTUM; COLD BIOPSY:  - CHRONIC COLITIS WITH SEVERE ACTIVITY (ULCERATION AND  CRYPT ABSCESS).  - POLYPOID LOW GRADE DYSPLASIA WITH TUBULAR ARCHITECTURE.  - NEGATIVE FOR GRANULOMA, DYSPLASIA, AND MALIGNANCY.   Colonoscopy 10/13/2019 - Perianal skin tags found on perianal exam. - The examined portion of the ileum was normal. - Simple Endoscopic Score for Crohn's Disease: 4, mucosal inflammatory changes secondary to Crohn's disease with colonic involvement. Biopsied. - Remarkable endoscopic improvement to remicade  DIAGNOSIS:  A. RIGHT COLON; COLD BIOPSY:  - COLONIC MUCOSA WITH INTACT CRYPT ARCHITECTURE.  - NEGATIVE FOR ACTIVE COLITIS, DYSPLASIA, AND MALIGNANCY.   B. LEFT COLON; COLD BIOPSY:  - COLONIC MUCOSA WITH INTACT CRYPT ARCHITECTURE.  - PANETH CELL METAPLASIA, MULTIFOCAL.  - NEGATIVE FOR ACTIVE INFLAMMATION, DYSPLASIA, AND MALIGNANCY.  - FEATURES ARE CONSISTENT WITH QUIESCENT COLITIS.   C. RECTUM; COLD BIOPSY:  - CHRONIC PROCTITIS WITH MINIMAL ACTIVE INFLAMMATION.  - NEGATIVE FOR DYSPLASIA AND MALIGNANCY.   D. RECTUM, DISTAL; COLD BIOPSY:  - CHRONIC PROCTITIS WITH MILD ACTIVE INFLAMMATION.  - NEGATIVE FOR DYSPLASIA AND MALIGNANCY.   Comment:  There is proctitis with crypt distortion and plasma cell infiltrates.  Significant neutrophilic infiltrates are present only in the distal  rectum, with neutrophils present in the lamina propria and surface  epithelium. All of the samples are negative for viral cytopathic  effects, granulomas, and dysplasia. The previous pathology report and  slides were reviewed ([id number], 06/15/2019). In comparison, the  current rectal samples show greatly reduced inflammation, without  evidence of epithelial atypia.   Flexible sigmoidoscopy 08/06/2020 - Perianal skin tags found on perianal exam. - The rectum, sigmoid colon, descending colon and splenic flexure are normal. Biopsied. DIAGNOSIS:  A. RECTUM; COLD BIOPSY:  - COLONIC MUCOSA WITH FOCAL LYMPHOID AGGREGATE.  - NEGATIVE FOR FEATURES OF CROHN'S, DYSPLASIA,  AND MALIGNANCY.    Upper Endoscopy none  VCE none  IBD medications:  Steroids: Prednisone responsive, tolerating well 5-ASA: None  immunomodulators: AZA started in 06/2019, methotrexate, nave TPMT status homozygous Biologics: Anti TNFs: Remicade monotherapy, started at age 51, previously every 12 weeks then moved to every 8 weeks And infliximab antibodies undetected on 06/09/2019 Restarted Remicade, first induction dose was 5 mg/kg, increase to 10 mg/kg from second induction dose Anti Integrins: Nave Ustekinumab: Nave Tofactinib: Nave Clinical trial: None  NSAIDs: None  Antiplts/Anticoagulants/Anti thrombotics: None  His cousin has Crohn's disease Denies family history of GI malignancy  Past Medical History:  Diagnosis Date  . Crohn's disease ([place])   . Inguinal hernia 01/2014   left  . Seasonal allergies     Past Surgical History:  Procedure Laterality Date  . COLONOSCOPY     x 2-3  . COLONOSCOPY WITH PROPOFOL N/A 06/15/2019   Procedure: COLONOSCOPY WITH PROPOFOL;  Surgeon: Lin Landsman, MD;  Location: [facility] ENDOSCOPY;  Service: Gastroenterology;  Laterality: N/A;  . COLONOSCOPY WITH PROPOFOL N/A 10/13/2019   Procedure: COLONOSCOPY WITH PROPOFOL;  Surgeon: Lin Landsman, MD;  Location: [facility]  ENDOSCOPY;  Service: Gastroenterology;  Laterality: N/A;  . FLEXIBLE SIGMOIDOSCOPY N/A 08/06/2020   Procedure: FLEXIBLE SIGMOIDOSCOPY;  Surgeon: Lin Landsman, MD;  Location: [facility] ENDOSCOPY;  Service: Gastroenterology;  Laterality: N/A;  . HERNIA REPAIR Right 2010  . INGUINAL HERNIA REPAIR Left 02/28/2014   Procedure: HERNIA REPAIR INGUINAL ADULT;  Surgeon: Adin Hector, MD;  Location: [place];  Service: General;  Laterality: Left;  . INSERTION OF MESH Left 02/28/2014   Procedure: INSERTION OF MESH;  Surgeon: Adin Hector, MD;  Location: [place];  Service: General;  Laterality: Left;    Current Outpatient Medications:   Marland Kitchen  Vitamin D, Ergocalciferol, (DRISDOL) 1.25 MG (50000 UT) CAPS capsule, Take 1 capsule (50,000 Units total) by mouth 2 (two) times a week., Disp: 24 capsule, Rfl: 1 .  Zoster Vaccine Adjuvanted [facility]) injection, Inject 0.5 mLs into the muscle once for 1 dose., Disp: 0.5 mL, Rfl: 0 .  inFLIXimab (REMICADE) 100 MG injection, Inject 700 mg into the vein every 8 (eight) weeks., Disp: 1 each, Rfl: 3    Family History  Problem Relation Age of Onset  . Cancer Mother   . Hypertension Mother   . Diabetes Father      Social History   Tobacco Use  . Smoking status: Never Smoker  . Smokeless tobacco: Never Used  Vaping Use  . Vaping Use: Never used  Substance Use Topics  . Alcohol use: Yes    Alcohol/week: 3.0 standard drinks    Types: 3 Standard drinks or equivalent per week  . Drug use: Yes    Types: Marijuana    Comment: 10/12/19    Allergies as of 03/19/2021  . (No Known Allergies)    Review of Systems:    All systems reviewed and negative except where noted in HPI.   Physical Exam:  BP (!) 149/94 (BP Location: Left Arm, Patient Position: Sitting, Cuff Size: Normal)   Pulse 78   Temp 98.3 F (36.8 C) (Oral)   Ht 6' 0.4" (1.839 m)   Wt 183 lb 6 oz (83.2 kg)   BMI 24.60 kg/m  No LMP for male patient.  General:   Alert, moderately built, moderately nourished, pleasant and cooperative in NAD, in good spirits Head:  Normocephalic and atraumatic. Eyes:  Sclera clear, no icterus.   Conjunctiva pink. Ears:  Normal auditory acuity. Nose:  No deformity, discharge, or lesions. Mouth:  No deformity or lesions,oropharynx pink & moist. Neck:  Supple; no masses or thyromegaly. Lungs:  Respirations even and unlabored.  Clear throughout to auscultation.   No wheezes, crackles, or rhonchi. No acute distress. Heart:  Regular rate and rhythm; no murmurs, clicks, rubs, or gallops. Abdomen:  Normal bowel sounds. Soft, non-tender and non-distended without masses, hepatosplenomegaly or  hernias noted.  No guarding or rebound tenderness.   Rectal: Not performed Msk:  Symmetrical without gross deformities. Good, equal movement & strength bilaterally. Pulses:  Normal pulses noted. Extremities:  No clubbing or edema.  No cyanosis. Neurologic:  Alert and oriented x3;  grossly normal neurologically. Skin:  Intact without significant lesions or rashes. No jaundice. Psych:  Alert and cooperative. Normal mood and affect.  Imaging Studies: Reviewed  Assessment and Plan:   RAYDAN SCHLABACH is a 40 y.o. male with Crohn's colitis, worse in rectum as well as perianal Crohn'n diagnosed at age 19, previously on Remicade monotherapy, exacerbation of Crohn's secondary to discontinuation of medication.  Colonoscopy confirmed mild colitis, severe proctitis with  dysplasia in the rectum, started on high-dose Remicade, azathioprine, antibiotics and prednisone taper in 07/2019.  Currently, on high-dose Remicade monotherapy, in clinical and histologic remission  Crohn's colitis: In clinical and histologic remission Continue high-dose Remicade every 8 weeks Labs every 3 months No evidence of dysplasia in the rectum based on biopsies, on recent flex sig in 10/21 Recommend colonoscopy in 10/22 Check infliximab antibodies and trough levels before next dose  Perianal fistula: Resolved Previously treated with metronidazole and ciprofloxacin  IBD Health Maintenance  1.TB status: Was indeterminate while on prednisone on 06/09/2019, repeat returned negative on 06/15/2019, updated QuantiFERON gold test 2. Anemia: Resolved, normal iron, B12 and folate levels 3.Immunizations: Twinrix first dose on 08/14/2019, second dose 11/28/19, third dose administeredon 06/29/2019, prevnar 08/14/2019, pneumovax administered on 07/09/2020, Varicella unknown status, Zoster recommend Shingrix vaccine by PCP 4.Cancer screening I) Colon cancer/dysplasia surveillance: Low-grade dysplasia in the rectum, repeat colonoscopy with  biopsies in 09/2019 with no evidence of dysplasia.  flexible sigmoidoscopy in 10/21 with no evidence of dysplasia II) Cervical cancer: n/a III) Skin cancer - counseled about annual skin exam by dermatology and skin protection in summer using sun screen SPF > 50, clothing 5.Bone health Vitamin D status: 29.1, mildly low, recommend calcium plus vitamin D daily Bone density testing: Not done 5. Labs: every 3 months 6. Smoking: Ex-smoker, smokes marijuana regularly 7. NSAIDs and Antibiotics use: none   Follow up in 6 months   Gregory Darby, MD

## 2021-03-20 ENCOUNTER — Encounter: Payer: Self-pay | Admitting: Gastroenterology

## 2021-03-20 LAB — IRON,TIBC AND FERRITIN PANEL
Ferritin: 63 ng/mL (ref 30–400)
Iron Saturation: 22 % (ref 15–55)
Iron: 76 ug/dL (ref 38–169)
Total Iron Binding Capacity: 349 ug/dL (ref 250–450)
UIBC: 273 ug/dL (ref 111–343)

## 2021-03-20 LAB — VITAMIN D 25 HYDROXY (VIT D DEFICIENCY, FRACTURES): Vit D, 25-Hydroxy: 35.3 ng/mL (ref 30.0–100.0)

## 2021-07-07 ENCOUNTER — Other Ambulatory Visit: Payer: Self-pay

## 2021-07-30 ENCOUNTER — Encounter: Payer: Self-pay | Admitting: Gastroenterology

## 2021-09-17 ENCOUNTER — Ambulatory Visit: Payer: BC Managed Care – PPO | Admitting: Gastroenterology

## 2021-09-24 ENCOUNTER — Ambulatory Visit: Payer: BC Managed Care – PPO | Admitting: Gastroenterology

## 2021-09-30 ENCOUNTER — Ambulatory Visit: Payer: BC Managed Care – PPO | Admitting: Gastroenterology

## 2021-10-02 ENCOUNTER — Telehealth: Payer: Self-pay

## 2021-10-02 NOTE — Telephone Encounter (Signed)
-----   Message from Lin Landsman, MD sent at 09/30/2021 12:21 PM EST ----- Regarding: Colonoscopy [place]  Please call patient and inform him that he needs a colonoscopy for follow-up of Crohn's disease  RV

## 2021-10-02 NOTE — Telephone Encounter (Signed)
CALLED PATIENT NO ANSWER LEFT VOICEMAIL FOR A CALL BACK ? ?

## 2021-10-03 ENCOUNTER — Telehealth: Payer: Self-pay

## 2021-10-03 NOTE — Telephone Encounter (Signed)
-----   Message from Lin Landsman, MD sent at 09/30/2021 12:21 PM EST ----- Regarding: Colonoscopy [place]  Please call patient and inform him that he needs a colonoscopy for follow-up of Crohn's disease  RV

## 2021-10-03 NOTE — Telephone Encounter (Signed)
CALLED PATIENT NO ANSWER LEFT VOICEMAIL FOR A CALL BACK ? ?

## 2021-10-06 ENCOUNTER — Telehealth: Payer: Self-pay

## 2021-10-06 ENCOUNTER — Encounter: Payer: Self-pay | Admitting: Gastroenterology

## 2021-10-06 NOTE — Telephone Encounter (Signed)
CALLED PATIENT NO ANSWER LEFT VOICEMAIL FOR A CALL BACK CALLED TO MAKE A APPOINTMENT FOR COLONOSCOPY

## 2021-10-06 NOTE — Telephone Encounter (Signed)
CALLED PATIENT NO ANSWER LEFT VOICEMAIL FOR A CALL BACK ? ?

## 2021-10-06 NOTE — Telephone Encounter (Signed)
-----   Message from Lin Landsman, MD sent at 09/30/2021 12:21 PM EST ----- Regarding: Colonoscopy [place]  Please call patient and inform him that he needs a colonoscopy for follow-up of Crohn's disease  RV

## 2021-10-06 NOTE — Telephone Encounter (Signed)
CALLED PATIENT NO ANSWER LEFT VOICEMAIL FOR A CALL BACK  letter sent

## 2021-12-15 ENCOUNTER — Telehealth: Payer: Self-pay

## 2021-12-15 NOTE — Telephone Encounter (Signed)
CALLED PATIENT NO ANSWER LEFT VOICEMAIL FOR A CALL BACK ? ?

## 2021-12-29 ENCOUNTER — Ambulatory Visit: Payer: BC Managed Care – PPO | Admitting: Gastroenterology

## 2021-12-29 ENCOUNTER — Telehealth: Payer: Self-pay

## 2021-12-29 NOTE — Telephone Encounter (Signed)
Tried to call patient 2 times to see if patient was coming to his appointment today. Unable to leave a message due to voicemail not being set up  ?

## 2022-01-12 ENCOUNTER — Other Ambulatory Visit: Payer: Self-pay

## 2022-01-13 ENCOUNTER — Other Ambulatory Visit: Payer: Self-pay

## 2022-01-13 ENCOUNTER — Encounter: Payer: Self-pay | Admitting: Gastroenterology

## 2022-01-13 ENCOUNTER — Ambulatory Visit (INDEPENDENT_AMBULATORY_CARE_PROVIDER_SITE_OTHER): Payer: BC Managed Care – PPO | Admitting: Gastroenterology

## 2022-01-13 VITALS — BP 154/84 | HR 69 | Temp 98.6°F | Ht 72.04 in | Wt 182.0 lb

## 2022-01-13 DIAGNOSIS — K50111 Crohn's disease of large intestine with rectal bleeding: Secondary | ICD-10-CM | POA: Diagnosis not present

## 2022-01-13 MED ORDER — SHINGRIX 50 MCG/0.5ML IM SUSR: 0.5000 mL | Freq: Once | INTRAMUSCULAR | 0 refills | Status: AC

## 2022-01-13 MED ORDER — GOLYTELY 236 G PO SOLR: 4000.0000 mL | Freq: Once | ORAL | 0 refills | Status: AC

## 2022-01-13 NOTE — Progress Notes (Signed)
?  ?Gregory Darby, MD ?[address]  ?[address]  ?[place],  [address]  ?Main: [phone]  ?Fax: [phone] ? ? ? ?Gastroenterology Consultation ? ?Referring Provider:     Minette Brine, FNP ?Primary Care Physician:  Minette Brine, FNP ?Primary Gastroenterologist:  Dr. Cephas Meyer ?Reason for Consultation:     Crohn's disease of the colon, perianal Crohn's ?      ? HPI:   ?Gregory Meyer is a 41 y.o. male referred by Minette Brine, [place]  for consultation & management of Crohn's disease of the colon, perianal Crohn's currently maintained on high-dose Remicade every 8 weeks.  Patient reports that he has been doing well.  He denies any symptoms related to Crohn's.  He reports occasional constipation depending on what he eats.  He denies any rectal discharge, flareup of perianal fistula.  Patient is past due by 2 weeks for his next Remicade infusion because he missed 6 months follow-up appointment in order to renew the prescription. ? ? ?Crohn's disease classification: ? ?Age: 65 to 66 ?Location:colonic  ?Behavior: non stricturing, penetrating  ?Perianal: Yes ? ?IBD diagnosis: Patient is diagnosed with Crohn's disease when he was 41 years old, his symptoms were rectal bleeding, rectal pain, bloody diarrhea, rectal discharge, weight loss ? ?Disease course: Right after diagnosis, patient was started on Remicade monotherapy.  Due to his insurance issues, he has been on and off Remicade to date.  The longest duration of Remicade was for 2 years without interruption.  His last Remicade dose was about 1 year ago.  He was receiving every 12 weeks when he was initially diagnosed and moved every 8 weeks.  He reports having breakthrough symptoms with 8 weeks interval on Remicade.  Patient was seeing Dr. Alessandra Bevels with [facility] gastroenterology as well as Dr. Collene Mares in [place].  He lost insurance, therefore lost to follow-up.  Currently, he has insurance to his wife and wanted to see an IBD specialist in  [place].  Patient reports that he was partially responding to Remicade.  He was receiving Flagyl as needed for rectal discharge which helps.  He does have severe rectal discomfort from external hemorrhoids which bleed from time to time.  His perianal skin is always sore and sometimes have to wear depends when discharge is worse.  He also has leakage of the stool.  He has been limiting his p.o. intake due to rectal urgency, diarrhea.  He has 4-5 bowel movements daily.  He lost about 20 pounds within last 1 year.  He does have normocytic anemia.  Patient is started on Flagyl by his PCP.  ?05/2019:  continued antibiotics with Cipro and metronidazole for perianal Crohn's.  MRI pelvis confirmed perianal fistula with no underlying abscess.  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  Patient is evaluated by Dr. Vincenza Hews at [facility] IBD center due to evidence of dysplasia in the rectum recommended to maximize medical therapy.  After the basic work-up, patient is started on high-dose Remicade 10 mg/kg body weight, azathioprine 150 mg daily, short course of prednisone, antibiotics.  Patient stopped azathioprine in early 2022 ? ?Extra intestinal manifestations: None ? ?IBD surgical history: None ? ?Imaging: ? ?MRE none ?MRI pelvis 06/08/2019 ?IMPRESSION: ?1. Transsphincteric left perianal fistula arising at the 2 o'clock ?position and tracking deep to the skin  along the left intergluteal ?fold. Signal void within the track as it extends to the skin may be ?related to gas within the track or the presence of a seton. ?2. No rim enhancing, drainable perianal abscess. ? ?CTE in 2011, normal ?SBFT none ? ?Procedures: ?Colonoscopy 12/24/2016 at [facility] gastroenterology by Dr. Alessandra Bevels ?Preparation of the  colon was fair ?External hemorrhoids ?Perianal fistula, inflamed, granular ulcerated mucosa up to 10 cm in the rectum proximal to anus, biopsied ?Normal visible mucosa in the sigmoid colon, descending colon, ascending colon, cecum and terminal ileum ? 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 ?Colonoscopy 06/15/2019 ?- Hemorrhoids found on perianal exam. ?- The examined portion of the ileum was normal. Biopsied. ?- Simple Endoscopic Score for Crohn's Disease 16:, mucosal inflammatory changes secondary to Crohn's disease ?with colonic involvement. Biopsied. ?- Crohn's disease with colonic involvement. Inflammation was found in the distal rectum most severe, mild in ?descending colon, in the transverse colon, in the ascending colon and at the cecum. ? ?DIAGNOSIS:  ?A. TERMINAL ILEUM; COLD BIOPSY:  ?- BENIGN ENTERIC MUCOSA WITH NORMAL VILLOUS ARCHITECTURE AND REACTIVE  ?FOLLICULAR LYMPHOID HYPERPLASIA.  ?- NEGATIVE FOR FEATURES OF ACTIVE ENTERITIS/ILEITIS.  ?- NEGATIVE FOR GRANULOMA, DYSPLASIA, AND MALIGNANCY.  ? ?B. COLON, RIGHT; COLD BIOPSY:  ?- CHRONIC COLITIS WITH MILD ACTIVITY (CRYPTITIS).  ?- SMALL SUBMUCOSAL NONCASEATING GRANULOMA.  ?- NEGATIVE FOR DYSPLASIA AND MALIGNANCY.  ? ?C. COLON, LEFT; COLD BIOPSY:  ?- CHRONIC COLITIS WITHOUT HISTOLOGIC EVIDENCE OF ACTIVE MUCOSAL  ?INFLAMMATION.  ?- SMALL SUBMUCOSAL NONCASEATING GRANULOMA.  ?- NEGATIVE FOR DYSPLASIA AND MALIGNANCY.  ? ?D. RECTUM; COLD BIOPSY:  ?- CHRONIC COLITIS WITH SEVERE ACTIVITY (ULCERATION AND CRYPT ABSCESS).  ?- POLYPOID LOW GRADE DYSPLASIA WITH TUBULAR ARCHITECTURE.  ?- NEGATIVE FOR GRANULOMA, DYSPLASIA, AND MALIGNANCY.  ? ?Colonoscopy 10/13/2019 ?- Perianal skin tags found on perianal  exam. ?- The examined portion of the ileum was normal. ?- Simple Endoscopic Score for Crohn's Disease: 4, mucosal inflammatory changes secondary to Crohn's disease ?with colonic involvement. Biopsied. ?- Remarkable endoscopic improvement to remicade ? ?DIAGNOSIS:  ?A. RIGHT COLON; COLD BIOPSY:  ?- COLONIC MUCOSA WITH INTACT CRYPT ARCHITECTURE.  ?- NEGATIVE FOR ACTIVE COLITIS, DYSPLASIA, AND MALIGNANCY.  ? ?B. LEFT COLON; COLD BIOPSY:  ?- COLONIC MUCOSA WITH INTACT CRYPT ARCHITECTURE.  ?- PANETH CELL METAPLASIA, MULTIFOCAL.  ?- NEGATIVE FOR ACTIVE INFLAMMATION, DYSPLASIA, AND MALIGNANCY.  ?- FEATURES ARE CONSISTENT WITH QUIESCENT COLITIS.  ? ?C. RECTUM; COLD BIOPSY:  ?- CHRONIC PROCTITIS WITH MINIMAL ACTIVE INFLAMMATION.  ?- NEGATIVE FOR DYSPLASIA AND MALIGNANCY.  ? ?D. RECTUM, DISTAL; COLD BIOPSY:  ?- CHRONIC PROCTITIS WITH MILD ACTIVE INFLAMMATION.  ?- NEGATIVE FOR DYSPLASIA AND MALIGNANCY.  ? ?Comment:  ?There is proctitis with crypt distortion and plasma cell infiltrates.  ?Significant neutrophilic infiltrates are present only in the distal  ?rectum, with neutrophils present in the lamina propria and surface  ?epithelium. All of the samples are negative for viral cytopathic  ?effects, granulomas, and dysplasia. The previous pathology report and  ?slides were reviewed ([id number], 06/15/2019). In comparison, the  ?current rectal samples show greatly reduced inflammation, without  ?evidence of epithelial atypia.  ? ?Flexible sigmoidoscopy 08/06/2020 ?- Perianal skin tags found on perianal exam. ?- The rectum, sigmoid colon, descending colon and splenic flexure are normal. Biopsied. ?  DIAGNOSIS:  ?A.  RECTUM; COLD BIOPSY:  ?- COLONIC MUCOSA WITH FOCAL LYMPHOID AGGREGATE.  ?- NEGATIVE FOR FEATURES OF CROHN'S, DYSPLASIA, AND MALIGNANCY.  ? ? ?Upper Endoscopy none ? ?VCE none ? ?IBD medications: ? 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

## 2022-01-20 LAB — SERIAL MONITORING

## 2022-01-22 LAB — CBC
Hematocrit: 37.8 % (ref 37.5–51.0)
Hemoglobin: 12.9 g/dL — ABNORMAL LOW (ref 13.0–17.7)
MCH: 31 pg (ref 26.6–33.0)
MCHC: 34.1 g/dL (ref 31.5–35.7)
MCV: 91 fL (ref 79–97)
Platelets: 249 10*3/uL (ref 150–450)
RBC: 4.16 x10E6/uL (ref 4.14–5.80)
RDW: 12.5 % (ref 11.6–15.4)
WBC: 4.5 10*3/uL (ref 3.4–10.8)

## 2022-01-22 LAB — COMPREHENSIVE METABOLIC PANEL
ALT: 20 IU/L (ref 0–44)
AST: 23 IU/L (ref 0–40)
Albumin/Globulin Ratio: 1.8 (ref 1.2–2.2)
Albumin: 4.5 g/dL (ref 4.0–5.0)
Alkaline Phosphatase: 72 IU/L (ref 44–121)
BUN/Creatinine Ratio: 13 (ref 9–20)
BUN: 11 mg/dL (ref 6–20)
Bilirubin Total: 0.3 mg/dL (ref 0.0–1.2)
CO2: 24 mmol/L (ref 20–29)
Calcium: 9.2 mg/dL (ref 8.7–10.2)
Chloride: 104 mmol/L (ref 96–106)
Creatinine, Ser: 0.83 mg/dL (ref 0.76–1.27)
Globulin, Total: 2.5 g/dL (ref 1.5–4.5)
Glucose: 87 mg/dL (ref 70–99)
Potassium: 4.1 mmol/L (ref 3.5–5.2)
Sodium: 143 mmol/L (ref 134–144)
Total Protein: 7 g/dL (ref 6.0–8.5)
eGFR: 115 mL/min/{1.73_m2} (ref >59)

## 2022-01-22 LAB — INFLIXIMAB+AB (SERIAL MONITOR)
Anti-Infliximab Antibody: <22 ng/mL
Infliximab Drug Level: 11 ug/mL

## 2022-01-22 LAB — VITAMIN D 25 HYDROXY (VIT D DEFICIENCY, FRACTURES): Vit D, 25-Hydroxy: 24.1 ng/mL — ABNORMAL LOW (ref 30.0–100.0)

## 2022-02-17 ENCOUNTER — Encounter: Payer: Self-pay | Admitting: Gastroenterology

## 2022-02-18 ENCOUNTER — Encounter: Payer: Self-pay | Admitting: Gastroenterology

## 2022-03-17 ENCOUNTER — Other Ambulatory Visit: Payer: Self-pay | Admitting: Gastroenterology

## 2022-03-17 ENCOUNTER — Encounter: Payer: Self-pay | Admitting: Gastroenterology

## 2022-03-17 DIAGNOSIS — K50111 Crohn's disease of large intestine with rectal bleeding: Secondary | ICD-10-CM

## 2022-03-17 MED ORDER — NA SULFATE-K SULFATE-MG SULF 17.5-3.13-1.6 GM/177ML PO SOLN: 354.0000 mL | Freq: Once | ORAL | 0 refills | Status: AC

## 2022-03-18 ENCOUNTER — Ambulatory Visit: Payer: BC Managed Care – PPO | Admitting: Anesthesiology

## 2022-03-18 ENCOUNTER — Encounter: Payer: Self-pay | Admitting: Gastroenterology

## 2022-03-18 ENCOUNTER — Ambulatory Visit
Admission: RE | Admit: 2022-03-18 | Discharge: 2022-03-18 | Disposition: A | Discharge status: Home / Self Care | Payer: BC Managed Care – PPO | Source: Home / Self Care | Attending: Gastroenterology | Admitting: Gastroenterology

## 2022-03-18 ENCOUNTER — Encounter
Admission: RE | Disposition: A | Discharge status: Home / Self Care | Payer: Self-pay | Source: Home / Self Care | Attending: Gastroenterology

## 2022-03-18 ENCOUNTER — Ambulatory Visit
Admission: RE | Admit: 2022-03-18 | Discharge: 2022-03-18 | Disposition: A | Discharge status: Home / Self Care | Payer: BC Managed Care – PPO | Attending: Gastroenterology | Admitting: Gastroenterology

## 2022-03-18 ENCOUNTER — Telehealth: Payer: Self-pay

## 2022-03-18 DIAGNOSIS — R221 Localized swelling, mass and lump, neck: Secondary | ICD-10-CM

## 2022-03-18 DIAGNOSIS — K50111 Crohn's disease of large intestine with rectal bleeding: Secondary | ICD-10-CM

## 2022-03-18 DIAGNOSIS — K5010 Crohn's disease of large intestine without complications: Secondary | ICD-10-CM | POA: Diagnosis present

## 2022-03-18 DIAGNOSIS — K644 Residual hemorrhoidal skin tags: Secondary | ICD-10-CM | POA: Insufficient documentation

## 2022-03-18 DIAGNOSIS — R599 Enlarged lymph nodes, unspecified: Secondary | ICD-10-CM

## 2022-03-18 HISTORY — PX: COLONOSCOPY WITH PROPOFOL: SHX5780

## 2022-03-18 SURGERY — COLONOSCOPY WITH PROPOFOL
Anesthesia: General | Laterality: Right

## 2022-03-18 MED ORDER — PROPOFOL 500 MG/50ML IV EMUL
INTRAVENOUS | Status: AC
  Filled 2022-03-18: qty 50

## 2022-03-18 MED ORDER — SODIUM CHLORIDE 0.9 % IV SOLN
INTRAVENOUS | Status: DC
  Administered 2022-03-18: 1000 mL via INTRAVENOUS

## 2022-03-18 MED ORDER — DEXMEDETOMIDINE (PRECEDEX) IN NS 20 MCG/5ML (4 MCG/ML) IV SYRINGE
PREFILLED_SYRINGE | INTRAVENOUS | Status: DC | PRN
  Administered 2022-03-18: 12 ug via INTRAVENOUS

## 2022-03-18 MED ORDER — PROPOFOL 500 MG/50ML IV EMUL
INTRAVENOUS | Status: DC | PRN
  Administered 2022-03-18: 175 ug/kg/min via INTRAVENOUS

## 2022-03-18 MED ORDER — LIDOCAINE HCL (CARDIAC) PF 100 MG/5ML IV SOSY
PREFILLED_SYRINGE | INTRAVENOUS | Status: DC | PRN
  Administered 2022-03-18: 40 mg via INTRAVENOUS

## 2022-03-18 MED ORDER — PROPOFOL 10 MG/ML IV BOLUS
INTRAVENOUS | Status: DC | PRN
  Administered 2022-03-18: 100 mg via INTRAVENOUS

## 2022-03-18 NOTE — Telephone Encounter (Signed)
Tried to call patient went straight to voicemail and voicemail is full

## 2022-03-18 NOTE — Op Note (Signed)
[facility] Patient Name: Gregory Meyer Procedure Date: 03/18/2022 7:17 AM MRN: [id number] Account #: [id number] Date of Birth: 1981-10-18 Admit Type: Outpatient Age: 41 Room: [facility] ENDO ROOM 4 Gender: Male Note Status: Finalized Instrument Name: Colonoscope [id number] Procedure:             Colonoscopy Indications:           Last colonoscopy: December 2020, Follow-up of Crohn's                         disease of the colon, Disease activity assessment of                         Crohn's disease of the colon, Assess therapeutic                         response to therapy of Crohn's disease of the colon Providers:             Lin Landsman MD, MD Referring MD:          Minette Brine (Referring MD) Medicines:             General Anesthesia Complications:         No immediate complications. Estimated blood loss: None. Procedure:             Pre-Anesthesia Assessment:                        - Prior to the procedure, a History and Physical was                         performed, and patient medications and allergies were                         reviewed. The patient is competent. The risks and                         benefits of the procedure and the sedation options and                         risks were discussed with the patient. All questions                         were answered and informed consent was obtained.                         Patient identification and proposed procedure were                         verified by the physician, the nurse, the                         anesthesiologist, the anesthetist and the technician                         in the pre-procedure area in the procedure room in the                         endoscopy suite. Mental Status Examination: alert and  oriented. Airway Examination: normal oropharyngeal                         airway and neck mobility. Respiratory Examination:                          clear to auscultation. CV Examination: normal.                         Prophylactic Antibiotics: The patient does not require                         prophylactic antibiotics. Prior Anticoagulants: The                         patient has taken no previous anticoagulant or                         antiplatelet agents. ASA Grade Assessment: II - A                         patient with mild systemic disease. After reviewing                         the risks and benefits, the patient was deemed in                         satisfactory condition to undergo the procedure. The                         anesthesia plan was to use general anesthesia.                         Immediately prior to administration of medications,                         the patient was re-assessed for adequacy to receive                         sedatives. The heart rate, respiratory rate, oxygen                         saturations, blood pressure, adequacy of pulmonary                         ventilation, and response to care were monitored                         throughout the procedure. The physical status of the                         patient was re-assessed after the procedure.                        After obtaining informed consent, the colonoscope was                         passed under direct vision. Throughout the procedure,  the patient's blood pressure, pulse, and oxygen                         saturations were monitored continuously. The                         Colonoscope was introduced through the anus and                         advanced to the 20 cm into the ileum. The colonoscopy                         was performed without difficulty. The patient                         tolerated the procedure well. The quality of the bowel                         preparation was evaluated using the BBPS [facility] Bowel                         Preparation Scale) with scores of: Right Colon = 3,                          Transverse Colon = 3 and Left Colon = 3 (entire mucosa                         seen well with no residual staining, small fragments                         of stool or opaque liquid). The total BBPS score                         equals 9. Findings:      Skin tags were found on perianal exam.      The terminal ileum appeared normal.      Normal mucosa was found in the rectum, in the left colon and in the       right colon. Biopsies were taken with a cold forceps for histology.      The retroflexed view of the distal rectum and anal verge was normal and       showed no anal or rectal abnormalities. Impression:            - Perianal skin tags found on perianal exam.                        - The examined portion of the ileum was normal.                        - Normal mucosa in the rectum, in the left colon and                         in the right colon. Biopsied.                        - The distal rectum and anal verge are normal on  retroflexion view. Recommendation:        - Discharge patient to home (with escort).                        - Resume regular diet today.                        - Continue present medications.                        - Await pathology results.                        - Return to my office as previously scheduled. Procedure Code(s):     --- Professional ---                        [phone], Colonoscopy, flexible; with biopsy, single or                         multiple Diagnosis Code(s):     --- Professional ---                        K64.4, Residual hemorrhoidal skin tags                        K50.10, Crohn's disease of large intestine without                         complications CPT copyright 2019 American Medical Association. All rights reserved. The codes documented in this report are preliminary and upon coder review may  be revised to meet current compliance requirements. Dr. Ulyess Mort Lin Landsman  MD, MD 03/18/2022 8:15:01 AM This report has been signed electronically. Number of Addenda: 0 Note Initiated On: 03/18/2022 7:17 AM Scope Withdrawal Time: 0 hours 16 minutes 40 seconds  Total Procedure Duration: 0 hours 19 minutes 59 seconds  Estimated Blood Loss:  Estimated blood loss: none.      [facility]

## 2022-03-18 NOTE — Telephone Encounter (Signed)
Emailed Sharrie Rothman about decreasing the Remicaide to 98m/kg every 8 weeks. Put in order for STAT U/s of neck. Called and got patient schedule for today right after his colonoscopy. Got down to registration and they will show him where to go. Called Endo and patient has already been discharge. Tried to call patient and patient phone goes straight to voicemail and voicemail is full. Tried to call patient emergency contact and she was not with patient and does not have another number for patient.

## 2022-03-18 NOTE — Telephone Encounter (Signed)
This time his phone rang but unable to leave a message because voicemail is full

## 2022-03-18 NOTE — Anesthesia Preprocedure Evaluation (Signed)
Anesthesia Evaluation  Patient identified by MRN, date of birth, ID band Patient awake    Reviewed: Allergy & Precautions, NPO status , Patient's Chart, lab work & pertinent test results  History of Anesthesia Complications Negative for: history of anesthetic complications  Airway Mallampati: I   Neck ROM: Full    Dental  (+) Chipped   Pulmonary neg pulmonary ROS,    Pulmonary exam normal breath sounds clear to auscultation       Cardiovascular Exercise Tolerance: Good negative cardio ROS Normal cardiovascular exam Rhythm:Regular Rate:Normal     Neuro/Psych Occasional marijuana use; last intake 03/16/22    GI/Hepatic Crohn disease   Endo/Other  negative endocrine ROS  Renal/GU negative Renal ROS     Musculoskeletal   Abdominal   Peds  Hematology negative hematology ROS (+)   Anesthesia Other Findings   Reproductive/Obstetrics                             Anesthesia Physical Anesthesia Plan  ASA: 2  Anesthesia Plan: General   Post-op Pain Management:    Induction: Intravenous  PONV Risk Score and Plan: 2 and Propofol infusion, TIVA and Treatment may vary due to age or medical condition  Airway Management Planned: Natural Airway  Additional Equipment:   Intra-op Plan:   Post-operative Plan:   Informed Consent: I have reviewed the patients History and Physical, chart, labs and discussed the procedure including the risks, benefits and alternatives for the proposed anesthesia with the patient or authorized representative who has indicated his/her understanding and acceptance.       Plan Discussed with: CRNA  Anesthesia Plan Comments: (LMA/GETA backup discussed.  Patient consented for risks of anesthesia including but not limited to:  - adverse reactions to medications - damage to eyes, teeth, lips or other oral mucosa - nerve damage due to positioning  - sore throat or  hoarseness - damage to heart, brain, nerves, lungs, other parts of body or loss of life  Informed patient about role of CRNA in peri- and intra-operative care.  Patient voiced understanding.)        Anesthesia Quick Evaluation

## 2022-03-18 NOTE — Anesthesia Procedure Notes (Signed)
Date/Time: 03/18/2022 7:46 AM Performed by: Doreen Salvage, CRNA Pre-anesthesia Checklist: Patient identified, Emergency Drugs available, Suction available and Patient being monitored Patient Re-evaluated:Patient Re-evaluated prior to induction Oxygen Delivery Method: Nasal cannula Induction Type: IV induction Dental Injury: Teeth and Oropharynx as per pre-operative assessment  Comments: Nasal cannula with etCO2 monitoring

## 2022-03-18 NOTE — Transfer of Care (Signed)
Immediate Anesthesia Transfer of Care Note  Patient: Gregory Meyer  Procedure(s) Performed: Procedure(s): COLONOSCOPY WITH PROPOFOL (Right)  Patient Location: PACU and Endoscopy Unit  Anesthesia Type:General  Level of Consciousness: sedated  Airway & Oxygen Therapy: Patient Spontanous Breathing and Patient connected to nasal cannula oxygen  Post-op Assessment: Report given to RN and Post -op Vital signs reviewed and stable  Post vital signs: Reviewed and stable  Last Vitals:  Vitals:   03/18/22 0719 03/18/22 0817  BP: 124/89 112/64  Pulse: 65 65  Resp: 18 11  Temp: (!) 35.9 C (!) 35.9 C  SpO2: 790% 24%    Complications: No apparent anesthesia complications

## 2022-03-18 NOTE — H&P (Signed)
Cephas Darby, MD [address]  [place]  [place], [place] [address]  Main: [phone]  Fax: [phone] Pager: [phone]  Primary Care Physician:  Minette Brine, FNP Primary Gastroenterologist:  Dr. Cephas Darby  Pre-Procedure History & Physical: HPI:  Gregory Meyer is a 41 y.o. male is here for an colonoscopy.   Past Medical History:  Diagnosis Date   Crohn's disease ([place])    Crohn's disease of large intestine with rectal bleeding ([place]) 01/29/2014   Crohn's disease with complication ([place])    Inguinal hernia 01/2014   left   Seasonal allergies     Past Surgical History:  Procedure Laterality Date   COLONOSCOPY     x 2-3   COLONOSCOPY WITH PROPOFOL N/A 06/15/2019   Procedure: COLONOSCOPY WITH PROPOFOL;  Surgeon: Lin Landsman, MD;  Location: [facility] ENDOSCOPY;  Service: Gastroenterology;  Laterality: N/A;   COLONOSCOPY WITH PROPOFOL N/A 10/13/2019   Procedure: COLONOSCOPY WITH PROPOFOL;  Surgeon: Lin Landsman, MD;  Location: [facility] ENDOSCOPY;  Service: Gastroenterology;  Laterality: N/A;   FLEXIBLE SIGMOIDOSCOPY N/A 08/06/2020   Procedure: FLEXIBLE SIGMOIDOSCOPY;  Surgeon: Lin Landsman, MD;  Location: [facility] ENDOSCOPY;  Service: Gastroenterology;  Laterality: N/A;   HERNIA REPAIR Right 2010   INGUINAL HERNIA REPAIR Left 02/28/2014   Procedure: HERNIA REPAIR INGUINAL ADULT;  Surgeon: Adin Hector, MD;  Location: [place];  Service: General;  Laterality: Left;   INSERTION OF MESH Left 02/28/2014   Procedure: INSERTION OF MESH;  Surgeon: Adin Hector, MD;  Location: [place];  Service: General;  Laterality: Left;    Prior to Admission medications   Medication Sig Start Date End Date Taking? Authorizing Provider  inFLIXimab (REMICADE) 100 MG injection Inject 700 mg into the vein every 8 (eight) weeks. 03/19/21  Yes Reilley Latorre, Tally Due, MD  Vitamin D, Ergocalciferol, (DRISDOL) 1.25 MG (50000 UT) CAPS capsule  Take 1 capsule (50,000 Units total) by mouth 2 (two) times a week. 10/23/19  Yes Minette Brine, FNP    Allergies as of 01/13/2022   (No Known Allergies)    Family History  Problem Relation Age of Onset   Cancer Mother    Hypertension Mother    Diabetes Father     Social History   Socioeconomic History   Marital status: Married    Spouse name: Not on file   Number of children: Not on file   Years of education: Not on file   Highest education level: Not on file  Occupational History   Not on file  Tobacco Use   Smoking status: Never   Smokeless tobacco: Never  Vaping Use   Vaping Use: Never used  Substance and Sexual Activity   Alcohol use: Yes    Alcohol/week: 3.0 standard drinks    Types: 3 Standard drinks or equivalent per week   Drug use: Yes    Types: Marijuana    Comment: [phone]   Sexual activity: Yes  Other Topics Concern   Not on file  Social History Narrative   Not on file   Social Determinants of Health   Financial Resource Strain: Not on file  Food Insecurity: Not on file  Transportation Needs: Not on file  Physical Activity: Not on file  Stress: Not on file  Social Connections: Not on file  Intimate Partner Violence: Not on file    Review of Systems: See HPI, otherwise negative ROS  Physical Exam: BP 124/89   Pulse 65  Temp (!) 96.6 F (35.9 C) (Temporal)   Resp 18   Ht 6' (1.829 m)   Wt 81.8 kg   SpO2 100%   BMI 24.46 kg/m  General:   Alert,  pleasant and cooperative in NAD Head:  Normocephalic and atraumatic. Neck:  Supple; no masses or thyromegaly. Lungs:  Clear throughout to auscultation.    Heart:  Regular rate and rhythm. Abdomen:  Soft, nontender and nondistended. Normal bowel sounds, without guarding, and without rebound.   Neurologic:  Alert and  oriented x4;  grossly normal neurologically.  Impression/Plan: Gregory Meyer is here for an colonoscopy to be performed for Crohn's disease of the colon, perianal  Crohn's  Risks, benefits, limitations, and alternatives regarding  colonoscopy have been reviewed with the patient.  Questions have been answered.  All parties agreeable.   Sherri Sear, MD  03/18/2022, 7:47 AM

## 2022-03-18 NOTE — Anesthesia Postprocedure Evaluation (Signed)
Anesthesia Post Note  Patient: MARKEVIOUS EHMKE  Procedure(s) Performed: COLONOSCOPY WITH PROPOFOL (Right)  Patient location during evaluation: PACU Anesthesia Type: General Level of consciousness: awake and alert, oriented and patient cooperative Pain management: pain level controlled Vital Signs Assessment: post-procedure vital signs reviewed and stable Respiratory status: spontaneous breathing, nonlabored ventilation and respiratory function stable Cardiovascular status: blood pressure returned to baseline and stable Postop Assessment: adequate PO intake Anesthetic complications: no   No notable events documented.   Last Vitals:  Vitals:   03/18/22 0827 03/18/22 0837  BP: 104/65 114/79  Pulse: 66 65  Resp: 14 14  Temp:    SpO2: 100% 100%    Last Pain:  Vitals:   03/18/22 0837  TempSrc:   PainSc: 0-No pain                 Darrin Nipper

## 2022-03-18 NOTE — Telephone Encounter (Signed)
-----   Message from Lin Landsman, MD sent at 03/18/2022  8:17 AM EDT ----- Regarding: [place] Please order [place] right neck as urgent Dx: right neck swelling, ? Lymph node, h/o crohn's, on immunosuppressive therapy  Also, let's decrease his remicade to 93m/kg every 8 weeks  RV

## 2022-03-18 NOTE — Telephone Encounter (Signed)
[facility] now

## 2022-03-19 ENCOUNTER — Telehealth: Payer: Self-pay

## 2022-03-19 LAB — SURGICAL PATHOLOGY

## 2022-03-19 NOTE — Telephone Encounter (Signed)
-----   Message from Lin Landsman, MD sent at 03/18/2022  5:50 PM EDT ----- Regarding: FW: [place] Kevan Ny, I meant to say to decrease Remicade to 5 mg/kg body weight every 8 weeks  RV ----- Message ----- From: Lin Landsman, MD Sent: 03/18/2022   8:19 AM EDT To: Shelby Mattocks, CMA Subject: [place]                                             Please order [place] right neck as urgent Dx: right neck swelling, ? Lymph node, h/o crohn's, on immunosuppressive therapy  Also, let's decrease his remicade to 36m/kg every 8 weeks  RV

## 2022-03-19 NOTE — Telephone Encounter (Signed)
Emailed Sharrie Rothman with [employer] with the change

## 2022-03-20 NOTE — Telephone Encounter (Signed)
Have placed reminder to remind me 08/01

## 2022-03-25 ENCOUNTER — Ambulatory Visit: Payer: BC Managed Care – PPO | Admitting: Gastroenterology

## 2022-04-01 ENCOUNTER — Telehealth: Payer: Self-pay

## 2022-04-01 NOTE — Telephone Encounter (Signed)
Submitted a PA to [facility] for patient Remicade infusion. We got back today a denial on patient  Remicade. They want patient to try Avsola (infliximab-axxq) and Inflectra (infliximab-dyyb).  Emailed Sharrie Rothman with [employer] [facility] to see If she can help [place] with this since patient has been on infusion.   If Dr. Marius Ditch needs to do appeal then she would need to write letter and fax it to.  [phone]

## 2022-04-02 NOTE — Telephone Encounter (Signed)
Emailed Dance movement psychotherapist of this information

## 2022-04-02 NOTE — Telephone Encounter (Signed)
Gregory Meyer  I am okay switching to Inflectra which is a bio similar to Remicade  RV

## 2022-04-02 NOTE — Telephone Encounter (Signed)
Per Sharrie Rothman with [employer] [facility] Yes just got off morning call and it will need an appeal or P2P. Its set up to go to appeals but that means waiting on our department to draft the appeal then get signature and fax in..   I would see if Dr. Marius Ditch wants to call in the number on the denial [id number] ext [phone] since he is overdue and has been on remicade all year.   If you can call it might speed up the process

## 2022-05-26 ENCOUNTER — Telehealth: Payer: Self-pay

## 2022-05-26 ENCOUNTER — Other Ambulatory Visit: Payer: Self-pay | Admitting: Gastroenterology

## 2022-05-26 DIAGNOSIS — R599 Enlarged lymph nodes, unspecified: Secondary | ICD-10-CM

## 2022-05-26 NOTE — Telephone Encounter (Signed)
-----   Message from Shelby Mattocks, [place] sent at 03/20/2022  7:49 AM EDT ----- Please set up repeat ultrasound

## 2022-05-26 NOTE — Telephone Encounter (Signed)
Order Ultrasound of the soft tissue neck. Will call and get schedule and call patient

## 2022-05-26 NOTE — Telephone Encounter (Signed)
Patient is scheduled for 06/02/2022 [facility] mall at 12:45 for a 1:00pm scan. Informed patient and patient verbalized understanding

## 2022-05-26 NOTE — Telephone Encounter (Signed)
Got patient schedule to get patient schedule they are trying to figure out how to get patient schedule. They will give me a call back to let me know when patient is schedule

## 2022-06-02 ENCOUNTER — Ambulatory Visit
Admission: RE | Admit: 2022-06-02 | Discharge: 2022-06-02 | Disposition: A | Discharge status: Home / Self Care | Payer: BC Managed Care – PPO | Source: Ambulatory Visit | Attending: Gastroenterology | Admitting: Gastroenterology

## 2022-06-02 DIAGNOSIS — R599 Enlarged lymph nodes, unspecified: Secondary | ICD-10-CM | POA: Diagnosis present

## 2022-07-13 ENCOUNTER — Encounter: Payer: Self-pay | Admitting: Gastroenterology

## 2022-07-20 ENCOUNTER — Ambulatory Visit: Payer: BC Managed Care – PPO | Admitting: Gastroenterology

## 2022-08-04 ENCOUNTER — Telehealth (INDEPENDENT_AMBULATORY_CARE_PROVIDER_SITE_OTHER): Payer: BC Managed Care – PPO | Admitting: Gastroenterology

## 2022-08-04 ENCOUNTER — Encounter: Payer: Self-pay | Admitting: Gastroenterology

## 2022-08-04 ENCOUNTER — Telehealth: Payer: Self-pay

## 2022-08-04 DIAGNOSIS — K5010 Crohn's disease of large intestine without complications: Secondary | ICD-10-CM | POA: Diagnosis not present

## 2022-08-04 NOTE — Telephone Encounter (Signed)
Patient is having car trouble so we change his visit today to a mychart visit

## 2022-08-04 NOTE — Progress Notes (Signed)
Sherri Sear, MD [address]  [place]  [place], [place] [address]  Main: [phone]  Fax: [phone]    Gastroenterology Consultation Video Visit  Referring Provider:     Minette Brine, FNP Primary Care Physician:  Minette Brine, FNP Primary Gastroenterologist:  Dr. Cephas Darby Reason for Consultation: Crohn's disease        HPI:   Gregory Meyer is a 41 y.o. male referred by Dr. Minette Brine, [place]  for consultation & management of Crohn's disease.  Virtual Visit Video Note  I connected with Gregory Meyer on 08/04/22 at  2:15 PM EDT by video and verified that I am speaking with the correct person using two identifiers.   I discussed the limitations, risks, security and privacy concerns of performing an evaluation and management service by video and the availability of in person appointments. I also discussed with the patient that there may be a patient responsible charge related to this service. The patient expressed understanding and agreed to proceed.  Location of the Patient: Home  Location of the provider: Office is  Persons participating in the visit: Patient and provider only   History of Present Illness: Gregory Meyer is a 41 year old pleasant African-American male with history of Crohn's colitis currently maintained on Inflectra 5 mg/kg body weight every 8 weeks.  Patient has been switched from Remicade to [place] in 03/2022 based on his insurance company.  He has been doing well.  He had an episode of severe constipation in mid September when he reached out to me, he tried stool softeners, fiber and MiraLAX and finally had a bowel movement.  He is taking Metamucil every day.  He denies any other GI complaints.  He underwent colonoscopy in May 2023 which revealed quiescent colitis, therefore Remicade dose has been decreased from 10 mg/kg body weight every 8 weeks to 5 mg/kg body weight every 8 weeks.  Patient has been tolerating  Inflectra well.  He is due for his labs.  Patient is no longer noticing enlarged lymph nodes in his neck.   Extra intestinal manifestations: None   IBD surgical history: None   Imaging:   MRE none MRI pelvis 06/08/2019 IMPRESSION: 1. Transsphincteric left perianal fistula arising at the 2 o'clock position and tracking deep to the skin along the left intergluteal fold. Signal void within the track as it extends to the skin may be related to gas within the track or the presence of a seton. 2. No rim enhancing, drainable perianal abscess.   CTE in 2011, normal SBFT none   Procedures: Colonoscopy 12/24/2016 at [facility] gastroenterology by Dr. 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 - The examined portion of the ileum was normal. Biopsied. - Simple Endoscopic Score for Crohn's Disease 16:, mucosal inflammatory changes secondary to Crohn's disease with colonic involvement. Biopsied. - Crohn's disease with colonic involvement. Inflammation was found in the distal rectum most severe, mild in descending colon, in the transverse colon,  in the ascending colon and at the cecum.   DIAGNOSIS:  A. TERMINAL ILEUM; COLD BIOPSY:  - BENIGN ENTERIC MUCOSA WITH NORMAL VILLOUS ARCHITECTURE AND REACTIVE  FOLLICULAR LYMPHOID HYPERPLASIA.  - NEGATIVE FOR FEATURES OF ACTIVE ENTERITIS/ILEITIS.  - NEGATIVE FOR  GRANULOMA, DYSPLASIA, AND MALIGNANCY.   B. COLON, RIGHT; COLD BIOPSY:  - CHRONIC COLITIS WITH MILD ACTIVITY (CRYPTITIS).  - SMALL SUBMUCOSAL NONCASEATING GRANULOMA.  - NEGATIVE FOR DYSPLASIA AND MALIGNANCY.   C. COLON, LEFT; COLD BIOPSY:  - CHRONIC COLITIS WITHOUT HISTOLOGIC EVIDENCE OF ACTIVE MUCOSAL  INFLAMMATION.  - SMALL SUBMUCOSAL NONCASEATING GRANULOMA.  - NEGATIVE FOR DYSPLASIA AND MALIGNANCY.   D. RECTUM; COLD BIOPSY:  - CHRONIC COLITIS WITH SEVERE ACTIVITY (ULCERATION AND CRYPT ABSCESS).  - POLYPOID LOW GRADE DYSPLASIA WITH TUBULAR ARCHITECTURE.  - NEGATIVE FOR GRANULOMA, DYSPLASIA, AND MALIGNANCY.    Colonoscopy 10/13/2019 - Perianal skin tags found on perianal exam. - The examined portion of the ileum was normal. - Simple Endoscopic Score for Crohn's Disease: 4, mucosal inflammatory changes secondary to Crohn's disease with colonic involvement. Biopsied. - Remarkable endoscopic improvement to remicade   DIAGNOSIS:  A. RIGHT COLON; COLD BIOPSY:  - COLONIC MUCOSA WITH INTACT CRYPT ARCHITECTURE.  - NEGATIVE FOR ACTIVE COLITIS, DYSPLASIA, AND MALIGNANCY.   B. LEFT COLON; COLD BIOPSY:  - COLONIC MUCOSA WITH INTACT CRYPT ARCHITECTURE.  - PANETH CELL METAPLASIA, MULTIFOCAL.  - NEGATIVE FOR ACTIVE INFLAMMATION, DYSPLASIA, AND MALIGNANCY.  - FEATURES ARE CONSISTENT WITH QUIESCENT COLITIS.   C. RECTUM; COLD BIOPSY:  - CHRONIC PROCTITIS WITH MINIMAL ACTIVE INFLAMMATION.  - NEGATIVE FOR DYSPLASIA AND MALIGNANCY.   D. RECTUM, DISTAL; COLD BIOPSY:  - CHRONIC PROCTITIS WITH MILD ACTIVE INFLAMMATION.  - NEGATIVE FOR DYSPLASIA AND MALIGNANCY.   Comment:  There is proctitis with crypt distortion and plasma cell infiltrates.  Significant neutrophilic infiltrates are present only in the distal  rectum, with neutrophils present in the lamina propria and surface  epithelium. All of the samples are negative for viral cytopathic  effects, granulomas, and dysplasia. The previous  pathology report and  slides were reviewed ([id number], 06/15/2019). In comparison, the  current rectal samples show greatly reduced inflammation, without  evidence of epithelial atypia.    Flexible sigmoidoscopy 08/06/2020 - Perianal skin tags found on perianal exam. - The rectum, sigmoid colon, descending colon and splenic flexure are normal. Biopsied. DIAGNOSIS:  A.  RECTUM; COLD BIOPSY:  - COLONIC MUCOSA WITH FOCAL LYMPHOID AGGREGATE.  - NEGATIVE FOR FEATURES OF CROHN'S, DYSPLASIA, AND MALIGNANCY.    Colonoscopy 03/18/2022 - Perianal skin tags found on perianal exam. - The examined portion of the ileum was normal. - Normal mucosa in the rectum, in the left colon and in the right colon. Biopsied.  DIAGNOSIS:  A.  COLON, RIGHT; BIOPSIES:  - COLONIC MUCOSA WITH NO SPECIFIC HISTOLOGIC ABNORMALITY.  - NO EVIDENCE OF ACTIVE COLITIS.  - NO SIGNIFICANT ATYPIA.   B.  COLON, LEFT; BIOPSIES:  - COLONIC MUCOSA WITH MILD NONSPECIFIC VASCULAR CONGESTION (LIKELY  BIOPSY RELATED).  - NO EVIDENCE OF ACTIVE COLITIS.  - NO SIGNIFICANT ATYPIA.   C.  RECTUM; BIOPSIES:  - RECTAL MUCOSA WITH RARE SMALL BENIGN APPEARING LYMPHOID AGGREGATES AND  RARE CRYPT DISTORTION SUGGESTIVE OF QUIESCENT COLITIS.  - NO EVIDENCE OF ACTIVE COLITIS AT THIS TIME.  - NO SIGNIFICANT ATYPIA.    Upper Endoscopy none   VCE none   IBD medications:   Steroids: Prednisone responsive, tolerating well 5-ASA: None  immunomodulators: AZA started in 06/2019, stopped in  early 2022, methotrexate, nave TPMT status homozygous Biologics: Anti TNFs: Remicade monotherapy, started at age 89, previously every 12 weeks then moved to every 8 weeks And infliximab antibodies undetected on 06/09/2019 Restarted Remicade, first induction dose was 5 mg/kg, increase to 10 mg/kg from second induction dose Anti Integrins: Nave Ustekinumab: Nave Tofactinib: Nave Clinical trial: None   NSAIDs: None   Antiplts/Anticoagulants/Anti  thrombotics: None   His cousin has Crohn's disease Denies family history of GI malignancy  Past Medical History:  Diagnosis Date   Crohn's disease ([place])    Crohn's disease of large intestine with rectal bleeding ([place]) 01/29/2014   Crohn's disease with complication ([place])    Inguinal hernia 01/2014   left   Seasonal allergies     Past Surgical History:  Procedure Laterality Date   COLONOSCOPY     x 2-3   COLONOSCOPY WITH PROPOFOL N/A 06/15/2019   Procedure: COLONOSCOPY WITH PROPOFOL;  Surgeon: Lin Landsman, MD;  Location: [facility] ENDOSCOPY;  Service: Gastroenterology;  Laterality: N/A;   COLONOSCOPY WITH PROPOFOL N/A 10/13/2019   Procedure: COLONOSCOPY WITH PROPOFOL;  Surgeon: Lin Landsman, MD;  Location: [facility] ENDOSCOPY;  Service: Gastroenterology;  Laterality: N/A;   COLONOSCOPY WITH PROPOFOL Right 03/18/2022   Procedure: COLONOSCOPY WITH PROPOFOL;  Surgeon: Lin Landsman, MD;  Location: [facility] ENDOSCOPY;  Service: Gastroenterology;  Laterality: Right;   FLEXIBLE SIGMOIDOSCOPY N/A 08/06/2020   Procedure: FLEXIBLE SIGMOIDOSCOPY;  Surgeon: Lin Landsman, MD;  Location: [facility] ENDOSCOPY;  Service: Gastroenterology;  Laterality: N/A;   HERNIA REPAIR Right 2010   INGUINAL HERNIA REPAIR Left 02/28/2014   Procedure: HERNIA REPAIR INGUINAL ADULT;  Surgeon: Adin Hector, MD;  Location: [place];  Service: General;  Laterality: Left;   INSERTION OF MESH Left 02/28/2014   Procedure: INSERTION OF MESH;  Surgeon: Adin Hector, MD;  Location: [place];  Service: General;  Laterality: Left;     Current Outpatient Medications:    inFLIXimab (REMICADE) 100 MG injection, Inject 700 mg into the vein every 8 (eight) weeks., Disp: 1 each, Rfl: 3   Vitamin D, Ergocalciferol, (DRISDOL) 1.25 MG (50000 UT) CAPS capsule, Take 1 capsule (50,000 Units total) by mouth 2 (two) times a week., Disp: 24 capsule, Rfl: 1   Family History  Problem Relation Age  of Onset   Cancer Mother    Hypertension Mother    Diabetes Father      Social History   Tobacco Use   Smoking status: Never   Smokeless tobacco: Never  Vaping Use   Vaping Use: Never used  Substance Use Topics   Alcohol use: Yes    Alcohol/week: 3.0 standard drinks of alcohol    Types: 3 Standard drinks or equivalent per week   Drug use: Yes    Types: Marijuana    Comment: [id number]    Allergies as of 08/04/2022   (No Known Allergies)    Assessment and Plan:   MICHAELANTHONY KEMPTON is a 41 y.o. male with Crohn's colitis, worse in rectum as well as perianal Crohn'n diagnosed at age 36, previously on Remicade monotherapy, exacerbation of Crohn's secondary to discontinuation of medication.  Colonoscopy confirmed mild colitis, severe proctitis with dysplasia in the rectum, started on high-dose Remicade, azathioprine, antibiotics and prednisone taper in 07/2019.  Currently, on high-dose Remicade monotherapy, in clinical remission and histologic remission   Crohn's colitis: In clinical remission and histologic remission Continue Inflectra 5 mg/kg body weight every 8 weeks Labs today and  every 6 months   Perianal fistula: Resolved Previously treated with metronidazole and ciprofloxacin   IBD Health Maintenance   1.TB status: Was indeterminate while on prednisone on 06/09/2019, repeat returned negative on 06/15/2019, updated QuantiFERON gold test 2. Anemia: Resolved, normal iron, B12 and folate levels 3.Immunizations: Twinrix first dose on 08/14/2019, second dose 11/28/19, third dose administeredon 06/29/2019, prevnar 08/14/2019, pneumovax administered on 07/09/2020, Varicella unknown status, Zoster recommend Shingrix vaccine 4.Cancer screening I) Colon cancer/dysplasia surveillance: Low-grade dysplasia in the rectum, repeat colonoscopy with biopsies in 09/2019 with no evidence of dysplasia.  flexible sigmoidoscopy in 10/21 with no evidence of dysplasia II) Cervical cancer: n/a III) Skin  cancer - counseled about annual skin exam by dermatology and skin protection in summer using sun screen SPF > 50, clothing 5.Bone health Vitamin D status: 29.1, mildly low, recheck levels today Bone density testing: Not done 5. Labs: With every other infusion 6. Smoking: Ex-smoker, smokes marijuana regularly 7. NSAIDs and Antibiotics use: none   Follow Up Instructions:   I discussed the assessment and treatment plan with the patient. The patient was provided an opportunity to ask questions and all were answered. The patient agreed with the plan and demonstrated an understanding of the instructions.   The patient was advised to call back or seek an in-person evaluation if the symptoms worsen or if the condition fails to improve as anticipated.  I provided 20 minutes of face-to-face time during this encounter.   Follow up in 6 months   Cephas Darby, MD

## 2023-03-11 ENCOUNTER — Telehealth: Payer: Self-pay

## 2023-03-11 NOTE — Telephone Encounter (Signed)
Patient received Inflectra 700mg  IV on 03/09/23, and he tolerated infusion without adverse reactions or side effects. He denies new complications or Crohn's symptoms since previous infusion. POT and meds reviewed with patient. Next infusion due in 8 weeks and scheduled for week of 05/04/2023.

## 2023-06-07 ENCOUNTER — Encounter (HOSPITAL_BASED_OUTPATIENT_CLINIC_OR_DEPARTMENT_OTHER): Payer: Self-pay | Admitting: Emergency Medicine

## 2023-06-07 ENCOUNTER — Other Ambulatory Visit: Payer: Self-pay

## 2023-06-07 ENCOUNTER — Emergency Department (HOSPITAL_BASED_OUTPATIENT_CLINIC_OR_DEPARTMENT_OTHER): Payer: No Typology Code available for payment source | Admitting: Radiology

## 2023-06-07 DIAGNOSIS — Y92410 Unspecified street and highway as the place of occurrence of the external cause: Secondary | ICD-10-CM | POA: Diagnosis not present

## 2023-06-07 DIAGNOSIS — S39012A Strain of muscle, fascia and tendon of lower back, initial encounter: Secondary | ICD-10-CM | POA: Insufficient documentation

## 2023-06-07 DIAGNOSIS — M545 Low back pain, unspecified: Secondary | ICD-10-CM | POA: Diagnosis present
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
## 2023-06-08 ENCOUNTER — Emergency Department (HOSPITAL_BASED_OUTPATIENT_CLINIC_OR_DEPARTMENT_OTHER)
Admission: EM | Admit: 2023-06-08 | Discharge: 2023-06-08 | Disposition: A | Discharge status: Home / Self Care | Payer: No Typology Code available for payment source | Attending: Emergency Medicine | Admitting: Emergency Medicine

## 2023-06-08 DIAGNOSIS — S39012A Strain of muscle, fascia and tendon of lower back, initial encounter: Secondary | ICD-10-CM

## 2023-06-08 MED ORDER — CYCLOBENZAPRINE HCL 5 MG PO TABS
5.0000 mg | ORAL_TABLET | Freq: Once | ORAL | Status: AC
  Administered 2023-06-08: 5 mg via ORAL
  Filled 2023-06-08: qty 1

## 2023-06-08 MED ORDER — LIDOCAINE 5 % EX PTCH
1.0000 | MEDICATED_PATCH | CUTANEOUS | Status: DC
  Administered 2023-06-08: 1 via TRANSDERMAL
  Filled 2023-06-08: qty 1

## 2023-06-08 MED ORDER — CYCLOBENZAPRINE HCL 10 MG PO TABS: 10.0000 mg | ORAL_TABLET | Freq: Two times a day (BID) | ORAL | 0 refills | Status: DC | PRN

## 2023-06-08 MED ORDER — ACETAMINOPHEN 500 MG PO TABS
1000.0000 mg | ORAL_TABLET | Freq: Once | ORAL | Status: AC
  Administered 2023-06-08: 1000 mg via ORAL
  Filled 2023-06-08: qty 2

## 2023-06-08 MED ORDER — LIDOCAINE 5 % EX PTCH: 1.0000 | MEDICATED_PATCH | CUTANEOUS | 0 refills | Status: DC

## 2023-06-08 NOTE — ED Notes (Signed)
 RN reviewed discharge instructions with pt. Pt verbalized understanding and had no further questions. VSS upon discharge.  
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
## 2023-06-10 ENCOUNTER — Telehealth: Payer: Self-pay
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
## 2023-06-17 ENCOUNTER — Encounter: Payer: Self-pay | Admitting: Family Medicine

## 2023-06-17 ENCOUNTER — Ambulatory Visit (INDEPENDENT_AMBULATORY_CARE_PROVIDER_SITE_OTHER): Payer: Self-pay | Admitting: Family Medicine

## 2023-06-17 DIAGNOSIS — E559 Vitamin D deficiency, unspecified: Secondary | ICD-10-CM

## 2023-06-17 DIAGNOSIS — M62838 Other muscle spasm: Secondary | ICD-10-CM
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
## 2023-06-18 DIAGNOSIS — E559 Vitamin D deficiency, unspecified: Secondary | ICD-10-CM | POA: Insufficient documentation

## 2023-06-18 DIAGNOSIS — M62838 Other muscle spasm: Secondary | ICD-10-CM | POA: Insufficient documentation

## 2023-06-18 LAB — CMP14+EGFR
ALT: 22 IU/L (ref 0–44)
AST: 22 IU/L (ref 0–40)
Albumin: 4.3 g/dL (ref 4.1–5.1)
Alkaline Phosphatase: 85 IU/L (ref 44–121)
BUN/Creatinine Ratio: 16 (ref 9–20)
BUN: 14 mg/dL (ref 6–20)
Bilirubin Total: 0.3 mg/dL (ref 0.0–1.2)
CO2: 26 mmol/L (ref 20–29)
Calcium: 9.4 mg/dL (ref 8.7–10.2)
Chloride: 102 mmol/L (ref 96–106)
Creatinine, Ser: 0.9 mg/dL (ref 0.76–1.27)
Globulin, Total: 2.6 g/dL (ref 1.5–4.5)
Glucose: 76 mg/dL (ref 70–99)
Potassium: 4.4 mmol/L (ref 3.5–5.2)
Sodium: 142 mmol/L (ref 134–144)
Total Protein: 6.9 g/dL (ref 6.0–8.5)
eGFR: 111 mL/min/{1.73_m2} (ref >59)

## 2023-06-18 LAB — CBC
Hematocrit: 38 % (ref 37.5–51.0)
Hemoglobin: 12.6 g/dL — ABNORMAL LOW (ref 13.0–17.7)
MCH: 30.7 pg (ref 26.6–33.0)
MCHC: 33.2 g/dL (ref 31.5–35.7)
MCV: 93 fL (ref 79–97)
Platelets: 232 10*3/uL (ref 150–450)
RBC: 4.1 x10E6/uL — ABNORMAL LOW (ref 4.14–5.80)
RDW: 13.5 % (ref 11.6–15.4)
WBC: 4.9 10*3/uL (ref 3.4–10.8)

## 2023-06-18 LAB — VITAMIN D 25 HYDROXY (VIT D DEFICIENCY, FRACTURES): Vit D, 25-Hydroxy: 29.5 ng/mL — ABNORMAL LOW (ref 30.0–100.0)

## 2023-06-18 MED ORDER — VITAMIN D (ERGOCALCIFEROL) 1.25 MG (50000 UNIT) PO CAPS: 50000.0000 [IU] | ORAL_CAPSULE | ORAL | 0 refills | Status: DC

## 2023-06-18 NOTE — Assessment & Plan Note (Signed)
Check lab

## 2023-06-18 NOTE — Assessment & Plan Note (Signed)
Lumbar x-ray 06/08/2023: NEG

## 2023-06-18 NOTE — Assessment & Plan Note (Signed)
Continue muscle relaxant given at ER

## 2023-09-30 ENCOUNTER — Encounter: Payer: Self-pay | Admitting: Nurse Practitioner

## 2023-11-10 ENCOUNTER — Encounter: Payer: Self-pay | Admitting: Gastroenterology

## 2023-11-10 ENCOUNTER — Ambulatory Visit: Payer: 59 | Admitting: Gastroenterology

## 2023-11-10 VITALS — BP 136/90 | HR 74 | Temp 98.6°F | Ht 72.0 in | Wt 176.2 lb

## 2023-11-10 DIAGNOSIS — F1290 Cannabis use, unspecified, uncomplicated: Secondary | ICD-10-CM | POA: Diagnosis not present

## 2023-11-10 DIAGNOSIS — K5010 Crohn's disease of large intestine without complications: Secondary | ICD-10-CM | POA: Diagnosis not present

## 2023-11-10 DIAGNOSIS — K50113 Crohn's disease of large intestine with fistula: Secondary | ICD-10-CM

## 2023-11-10 NOTE — Progress Notes (Signed)
 Karma Oz, MD [address]  [address]  [place], [place] [address]  Main: [phone]  Fax: [phone]    Gastroenterology Consultation  Referring Provider:     Susanna Epley, FNP Primary Care Physician:  Susanna Epley, FNP Primary Gastroenterologist:  Dr. Karma Oz Reason for Consultation:     Crohn's disease of the colon, perianal Crohn's        HPI:   Gregory Meyer is a 43 y.o. male referred by Susanna Epley, FNP, with history of Crohn's colitis currently maintained on Inflectra  5 mg/kg body weight every 8 weeks.  Patient has been switched from Remicade  to Inflectra  in 03/2022 based on his insurance company.  Patient has not seen me in the office for more than a year.  He lost his job and therefore his insurance.  His last infusion was in May.  He currently has signed up for [employer] from marketplace starting January 1.  He denies any exacerbation of symptoms related to Crohn's since of Inflectra .  He reports having 3 semiformed bowel movements daily, weight has been stable, reports good appetite.  His labs from 8/24 revealed mild normocytic anemia, normal CMP, mild vitamin D  deficiency     Extra intestinal manifestations: None   IBD surgical history: None   Imaging:   MRE none MRI pelvis 06/08/2019 IMPRESSION: 1. Transsphincteric left perianal fistula arising at the 2 o'clock position and tracking deep to the skin along the left intergluteal fold. Signal void within the track as it extends to the skin may be related to gas within the track or the presence of a seton. 2. No rim enhancing, drainable perianal abscess.   CTE in 2011, normal SBFT none   Procedures: Colonoscopy 12/24/2016 at [facility] gastroenterology by Dr. 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 - The examined portion of the ileum was normal. Biopsied. - Simple Endoscopic Score for Crohn's Disease 16:, mucosal inflammatory changes secondary to Crohn's disease with colonic involvement. Biopsied. - Crohn's disease with colonic involvement. Inflammation was found in the distal rectum most severe, mild in descending colon, in the transverse colon, in the ascending colon and at the cecum.   DIAGNOSIS:  A. TERMINAL ILEUM; COLD BIOPSY:  - BENIGN ENTERIC MUCOSA WITH NORMAL VILLOUS ARCHITECTURE AND REACTIVE  FOLLICULAR LYMPHOID HYPERPLASIA.  - NEGATIVE FOR FEATURES OF ACTIVE ENTERITIS/ILEITIS.  - NEGATIVE FOR GRANULOMA, DYSPLASIA, AND MALIGNANCY.   B. COLON, RIGHT; COLD BIOPSY:  - CHRONIC COLITIS WITH MILD ACTIVITY (CRYPTITIS).  - SMALL SUBMUCOSAL NONCASEATING GRANULOMA.  - NEGATIVE FOR DYSPLASIA AND MALIGNANCY.   C. COLON, LEFT; COLD BIOPSY:  - CHRONIC COLITIS WITHOUT HISTOLOGIC EVIDENCE OF ACTIVE MUCOSAL  INFLAMMATION.  - SMALL SUBMUCOSAL NONCASEATING GRANULOMA.  - NEGATIVE FOR DYSPLASIA AND MALIGNANCY.   D. RECTUM; COLD BIOPSY:  - CHRONIC COLITIS WITH SEVERE ACTIVITY (ULCERATION AND CRYPT ABSCESS).  - POLYPOID LOW GRADE DYSPLASIA WITH TUBULAR ARCHITECTURE.  - NEGATIVE FOR GRANULOMA, DYSPLASIA, AND MALIGNANCY.  Colonoscopy 10/13/2019 - Perianal skin tags found on perianal exam. - The examined portion of the ileum was normal. - Simple Endoscopic Score for Crohn's Disease: 4, mucosal inflammatory changes secondary to Crohn's disease with colonic involvement. Biopsied. -  Remarkable endoscopic improvement to remicade    DIAGNOSIS:  A. RIGHT COLON; COLD BIOPSY:  - COLONIC MUCOSA WITH INTACT CRYPT ARCHITECTURE.  - NEGATIVE FOR ACTIVE COLITIS, DYSPLASIA, AND MALIGNANCY.   B. LEFT COLON; COLD BIOPSY:  - COLONIC MUCOSA WITH INTACT CRYPT ARCHITECTURE.  - PANETH CELL METAPLASIA, MULTIFOCAL.  - NEGATIVE FOR ACTIVE INFLAMMATION, DYSPLASIA, AND MALIGNANCY.  - FEATURES ARE CONSISTENT WITH QUIESCENT COLITIS.   C. RECTUM; COLD BIOPSY:  - CHRONIC PROCTITIS WITH MINIMAL ACTIVE INFLAMMATION.  - NEGATIVE FOR DYSPLASIA AND MALIGNANCY.   D. RECTUM, DISTAL; COLD BIOPSY:  - CHRONIC PROCTITIS WITH MILD ACTIVE INFLAMMATION.  - NEGATIVE FOR DYSPLASIA AND MALIGNANCY.   Comment:  There is proctitis with crypt distortion and plasma cell infiltrates.  Significant neutrophilic infiltrates are present only in the distal  rectum, with neutrophils present in the lamina propria and surface  epithelium. All of the samples are negative for viral cytopathic  effects, granulomas, and dysplasia. The previous pathology report and  slides were reviewed ([id number], 06/15/2019). In comparison, the  current rectal samples show greatly reduced inflammation, without  evidence of epithelial atypia.    Flexible sigmoidoscopy 08/06/2020 - Perianal skin tags found on perianal exam. - The rectum, sigmoid colon, descending colon and splenic flexure are normal. Biopsied. DIAGNOSIS:  A.  RECTUM; COLD BIOPSY:  - COLONIC MUCOSA WITH FOCAL LYMPHOID AGGREGATE.  - NEGATIVE FOR FEATURES OF CROHN'S, DYSPLASIA, AND MALIGNANCY.    Colonoscopy 03/18/2022 - Perianal skin tags found on perianal exam. - The examined portion of the ileum was normal. - Normal mucosa in the rectum, in the left colon and in the right colon. Biopsied.   DIAGNOSIS:  A.  COLON, RIGHT; BIOPSIES:  - COLONIC MUCOSA WITH NO SPECIFIC HISTOLOGIC ABNORMALITY.  - NO EVIDENCE OF ACTIVE COLITIS.  - NO SIGNIFICANT ATYPIA.   B.   COLON, LEFT; BIOPSIES:  - COLONIC MUCOSA WITH MILD NONSPECIFIC VASCULAR CONGESTION (LIKELY  BIOPSY RELATED).  - NO EVIDENCE OF ACTIVE COLITIS.  - NO SIGNIFICANT ATYPIA.   C.  RECTUM; BIOPSIES:  - RECTAL MUCOSA WITH RARE SMALL BENIGN APPEARING LYMPHOID AGGREGATES AND  RARE CRYPT DISTORTION SUGGESTIVE OF QUIESCENT COLITIS.  - NO EVIDENCE OF ACTIVE COLITIS AT THIS TIME.  - NO SIGNIFICANT ATYPIA.      Upper Endoscopy none   VCE none   IBD medications:   Steroids: Prednisone  responsive, tolerating well 5-ASA: None  immunomodulators: AZA started in 06/2019, stopped in early 2022, methotrexate, nave TPMT status homozygous Biologics: Anti TNFs: Remicade  monotherapy, started at age 56, previously every 12 weeks then moved to every 8 weeks And infliximab  antibodies undetected on 06/09/2019 Restarted Remicade , first induction dose was 5 mg/kg, increase to 10 mg/kg from second induction dose Anti Integrins: Nave Ustekinumab: Nave Tofactinib: Nave Clinical trial: None   NSAIDs: None   Antiplts/Anticoagulants/Anti thrombotics: None   His cousin has Crohn's disease Denies family history of GI malignancy  Past Medical History:  Diagnosis Date   Crohn's disease (HCC)    Crohn's disease of large intestine with rectal bleeding (HCC) 01/29/2014   Crohn's disease with complication (HCC)    Inguinal hernia 01/2014   left   Seasonal allergies     Past Surgical History:  Procedure Laterality Date   COLONOSCOPY  x 2-3   COLONOSCOPY WITH PROPOFOL  N/A 06/15/2019   Procedure: COLONOSCOPY WITH PROPOFOL ;  Surgeon: Selena Daily, MD;  Location: [facility] ENDOSCOPY;  Service: Gastroenterology;  Laterality: N/A;   COLONOSCOPY WITH PROPOFOL  N/A 10/13/2019   Procedure: COLONOSCOPY WITH PROPOFOL ;  Surgeon: Selena Daily, MD;  Location: [facility] ENDOSCOPY;  Service: Gastroenterology;  Laterality: N/A;   COLONOSCOPY WITH PROPOFOL  Right 03/18/2022   Procedure: COLONOSCOPY WITH PROPOFOL ;   Surgeon: Selena Daily, MD;  Location[facility] ENDOSCOPY;  Service: Gastroenterology;  Laterality: Right;   FLEXIBLE SIGMOIDOSCOPY N/A 08/06/2020   Procedure: FLEXIBLE SIGMOIDOSCOPY;  Surgeon: Selena Daily, MD;  Location: [facility] ENDOSCOPY;  Service: Gastroenterology;  Laterality: N/A;   HERNIA REPAIR Right 2010   INGUINAL HERNIA REPAIR Left 02/28/2014   Procedure: HERNIA REPAIR INGUINAL ADULT;  Surgeon: Levert Ready, MD;  Location: [facility];  Service: General;  Laterality: Left;   INSERTION OF MESH Left 02/28/2014   Procedure: INSERTION OF MESH;  Surgeon: Levert Ready, MD;  Location: [facility];  Service: General;  Laterality: Left;    Current Outpatient Medications:    inFLIXimab  (REMICADE ) 100 MG injection, Inject 700 mg into the vein every 8 (eight) weeks. (Patient not taking: Reported on 11/10/2023), Disp: 1 each, Rfl: 3    Family History  Problem Relation Age of Onset   Cancer Mother    Hypertension Mother    Diabetes Father      Social History   Tobacco Use   Smoking status: Never   Smokeless tobacco: Never  Vaping Use   Vaping status: Never Used  Substance Use Topics   Alcohol use: Yes    Alcohol/week: 3.0 standard drinks of alcohol    Types: 3 Standard drinks or equivalent per week   Drug use: Yes    Types: Marijuana    Comment: [id number]    Allergies as of 11/10/2023   (No Known Allergies)    Review of Systems:    All systems reviewed and negative except where noted in HPI.   Physical Exam:  BP (!) 136/90 (BP Location: Left Arm, Patient Position: Sitting, Cuff Size: Normal)   Pulse 74   Temp 98.6 F (37 C) (Oral)   Ht 6' (1.829 m)   Wt 176 lb 4 oz (79.9 kg)   BMI 23.90 kg/m  No LMP for male patient.  General:   Alert, moderately built, moderately nourished, pleasant and cooperative in NAD, in good spirits Head:  Normocephalic and atraumatic. Eyes:  Sclera clear, no icterus.   Conjunctiva pink. Ears:  Normal  auditory acuity. Nose:  No deformity, discharge, or lesions. Mouth:  No deformity or lesions,oropharynx pink & moist. Neck:  Supple; no masses or thyromegaly. Lungs:  Respirations even and unlabored.  Clear throughout to auscultation.   No wheezes, crackles, or rhonchi. No acute distress. Heart:  Regular rate and rhythm; no murmurs, clicks, rubs, or gallops. Abdomen:  Normal bowel sounds. Soft, non-tender and non-distended without masses, hepatosplenomegaly or hernias noted.  No guarding or rebound tenderness.   Rectal: Not performed Msk:  Symmetrical without gross deformities. Good, equal movement & strength bilaterally. Pulses:  Normal pulses noted. Extremities:  No clubbing or edema.  No cyanosis. Neurologic:  Alert and oriented x3;  grossly normal neurologically. Skin:  Intact without significant lesions or rashes. No jaundice. Psych:  Alert and cooperative. Normal mood and affect.  Imaging Studies: Reviewed  Assessment and Plan:   ALVIA MARK is a 43 y.o. male  with Crohn's colitis, worse in rectum as well as perianal Crohn'n diagnosed at age 51, previously on Remicade  monotherapy, exacerbation of Crohn's secondary to discontinuation of medication.  Colonoscopy confirmed mild colitis, severe proctitis with dysplasia in the rectum, started on high-dose Remicade , azathioprine , antibiotics and prednisone  taper in 07/2019, has been on high-dose Remicade  monotherapy until 02/2022, colonoscopy revealed complete histologic remission, therefore switched to 5 mg/kg body weight every 8 weeks, maintaining clinical remission, switched to Inflectra  in 03/2022. Patient lost insurance in May 2024, has been off Inflectra  since then   Crohn's colitis: In clinical remission and histologic remission Will apply for restart Inflectra  5 mg/kg body weight induction and maintenance every 8 weeks If Inflectra  is not approved by his insurance, recommend switching to antiinterleukin agents   Perianal  fistula: Resolved Previously treated with metronidazole  and ciprofloxacin    IBD Health Maintenance   1.TB status: Was indeterminate while on prednisone  on 06/09/2019, repeat returned negative on 06/15/2019, updated QuantiFERON gold test 2. Anemia: Resolved, normal iron, B12 and folate levels 3.Immunizations: Twinrix first dose on 08/14/2019, second dose 11/28/19, third dose administeredon 06/29/2019, prevnar 08/14/2019, pneumovax administered on 07/09/2020, Varicella unknown status, Zoster recommend Shingrix  vaccine 4.Cancer screening I) Colon cancer/dysplasia surveillance: Low-grade dysplasia in the rectum, repeat colonoscopy with biopsies in 09/2019 with no evidence of dysplasia.  flexible sigmoidoscopy in 10/21 with no evidence of dysplasia II) Cervical cancer: n/a III) Skin cancer - counseled about annual skin exam by dermatology and skin protection in summer using sun screen SPF > 50, clothing 5.Bone health Vitamin D  status: 29.1, mildly low, recheck levels today Bone density testing: Not done 5. Labs: With every other infusion 6. Smoking: Ex-smoker, smokes marijuana regularly 7. NSAIDs and Antibiotics use: none   Follow up in 4 months   Karma Oz, MD

## 2023-11-11 ENCOUNTER — Telehealth: Payer: Self-pay

## 2023-11-11 MED ORDER — SODIUM CHLORIDE 0.9 % IV SOLN: 5.0000 mg/kg | INTRAVENOUS | 10 refills | Status: DC

## 2023-11-11 NOTE — Telephone Encounter (Signed)
[employer] and they said that Infliximab was on formulary but we would have to do a PA. They would not let me do the PA over the phone they said I had to do it by fax or on line through [employer]. Printed the form off online and faxed it to [phone]. The phone number is [phone]. They said standard PA turn around time was 15 days urgent PA turn around time was 24 to 72 hours. I marked the PA as urgent when I faxed it in. I also called [facility] who partners with [facility] with there infusions and they are able to do the home infusions. They said I just needed to send them the order, Pre and post meds orders, if any labs needs to be drawn, office visit notes , demographics, medication lists, insurance. I printed all this and faxed it to [phone]. They said they would input in all the information and let us know if they need anything else. The phone number there is [phone]. I got confirmation on both faxes they went through.

## 2023-11-15 NOTE — Telephone Encounter (Deleted)
Insurance approved the starter and the every 8 weeks from 11/11/2023 to 05/09/2024

## 2023-11-15 NOTE — Telephone Encounter (Signed)
Tried to [facility] but they are closed for [employer] day. Will call them tomorrow

## 2023-11-15 NOTE — Telephone Encounter (Signed)
Insurance company has denied the Remicade but states they will cover the Inflectra will call insurance company first thing this morning

## 2023-11-16 MED ORDER — INFLECTRA 100 MG IV SOLR: INTRAVENOUS | 10 refills | Status: AC

## 2023-11-16 NOTE — Telephone Encounter (Signed)
Called and did a expedited appeal on the phone for patient over the phone by leaving a message. They said if they thought it was expedited a decision would be made in 24 hours.

## 2023-11-16 NOTE — Telephone Encounter (Signed)
Called [facility] at [phone] and the first two infusions will be done at [facility] and then it will be done through home infusion. Informed them it has been approved by patient insurance company. They said it would be 24 to 48 hours and he should be ready to schedule.

## 2023-11-16 NOTE — Addendum Note (Signed)
Addended by: Radene Knee L on: 11/16/2023 08:50 AM   Modules accepted: Orders

## 2023-11-16 NOTE — Telephone Encounter (Signed)
Tried to call the PA department on the back of the card and they said that I needed to talk to the speciality department at [phone] but they do not open till 9.

## 2023-11-16 NOTE — Telephone Encounter (Signed)
The Inflectra has been approved by patient insurance they will be faxing [place] the approval. Approval number [id number] 11/15/2023 11/13/2024.

## 2023-11-19 NOTE — Telephone Encounter (Signed)
Talk to [facility] this morning and he states that it is noted in his chart that [facility] states that they do not have a open Infusion site in [place]. I asked the man how do they except [place] to do do the induction then because his insurance require him to go through [facility] and [facility] requires him to have the induction does at a infusion site. He states he can do home infusions. Informed him we were fine for everything being home infusion but I have always been told by [facility] that they are requiring to have induction at a infusion site. He state he will send a email to the nursing staff and they will give me a call. Informed him no I would like to talk to someone and have there number. He got me a nursing staff number and her name his Gregory Meyer. Her number is [phone]. He said to call her and she could help me. I called her and left a message for call back

## 2023-11-22 NOTE — Telephone Encounter (Signed)
Since Olegario Messier will not call me back called [facility] at [phone] and pressed the referral option which was option 1. The lady that answer was very rude and states she can not help me because the  [facility] [place] location is closing there doors on Friday. They will still have nursing staff to do the Home infusions but they will not have a infusion center. They said another number to call is there hub at [phone]. Called [facility] the lady I talk to was very knowledgeable. She was researching all the information. She states some one noted on 11/20/2023 that PA was still pending. informed her received on 11/15/2023 the PA was approved. She states second since the medical side of insurance the patient is going to have to call to order the medication. He will need to order the medication before he schedules his infusion because it takes a while for the process. After the first order the patient or the nurse needs the order the medication at least 8 days before he is schedule for infusion.  She then transfer me to Hilda Lias and they said the infusion has to be done at a free standing infusion center or at a out patient facility. They said after the induction he can have the  maintenance does at home through home infusion.  I gave her the address for [facility] Same day surgery. Called same Day surgery at [phone] but she said the lady that schedule this is at lunch and to call back after lunch

## 2023-11-22 NOTE — Telephone Encounter (Signed)
Called Olegario Messier and left a message for call back to find out about what going on with this infusion.

## 2023-11-23 ENCOUNTER — Other Ambulatory Visit: Payer: Self-pay

## 2023-11-23 ENCOUNTER — Other Ambulatory Visit: Payer: Self-pay | Admitting: Gastroenterology

## 2023-11-23 NOTE — Telephone Encounter (Signed)
Called same day surgery at [facility] and they said they are unable to do the infusions when they are shipped from [facility] for Inflectra. She said she had two contacts for me to try one is to email Bayard Hugger who is the head of the infusion for [facility] and then second to call [place] [facility] at [facility]  with [facility] at [phone]. Called [facility] and left a message for call back. The address is [address], [address] [place],  [place]  [address].  Phone number online is [phone].  I also emailed Bayard Hugger to see if there is any help he could give me.

## 2023-11-23 NOTE — Telephone Encounter (Signed)
Per Christian Mate  Please fax the orders insurance and other patient information to [phone] and we will arrange for the patient to have his infusion at [facility]. We will call them to schedule once we have the medication in hand.   Thanks!  Brett Canales

## 2023-11-23 NOTE — Telephone Encounter (Signed)
Called [facility] and gave them the address for [address] location. Called patient and explained to him what was going on and gave him the number of [facility] so they could order the medication for patient.

## 2023-11-29 NOTE — Telephone Encounter (Signed)
The Medication is supposed to be delivered to the Infusion site with [facility] on 12/01/2023.

## 2023-11-30 DIAGNOSIS — K5010 Crohn's disease of large intestine without complications: Secondary | ICD-10-CM | POA: Diagnosis not present

## 2023-12-01 NOTE — Telephone Encounter (Signed)
Patient first Inflectra infusion scheduled for Friday, February 14th at 1:00pm. Will set up the home infusions for after he gets done with the induction doses.

## 2023-12-10 ENCOUNTER — Ambulatory Visit (INDEPENDENT_AMBULATORY_CARE_PROVIDER_SITE_OTHER): Payer: 59

## 2023-12-10 VITALS — BP 143/97 | HR 89 | Temp 98.0°F | Resp 18 | Ht 72.0 in | Wt 178.8 lb

## 2023-12-10 DIAGNOSIS — K5010 Crohn's disease of large intestine without complications: Secondary | ICD-10-CM

## 2023-12-10 MED ORDER — ACETAMINOPHEN 325 MG PO TABS: 650.0000 mg | ORAL_TABLET | Freq: Once | ORAL | Status: DC

## 2023-12-10 MED ORDER — DIPHENHYDRAMINE HCL 25 MG PO CAPS
25.0000 mg | ORAL_CAPSULE | Freq: Once | ORAL | Status: DC
Start: 2023-12-10 — End: 2023-12-10

## 2023-12-10 MED ORDER — SODIUM CHLORIDE 0.9 % IV SOLN
5.0000 mg/kg | Freq: Once | INTRAVENOUS | Status: AC
  Administered 2023-12-10: 400 mg via INTRAVENOUS
  Filled 2023-12-10: qty 40

## 2023-12-10 MED ORDER — METHYLPREDNISOLONE SODIUM SUCC 40 MG IJ SOLR: 40.0000 mg | Freq: Once | INTRAMUSCULAR | Status: DC

## 2023-12-10 NOTE — Progress Notes (Addendum)
Diagnosis: Crohn's Disease  Provider:  Chilton Greathouse MD  Procedure: IV Infusion  IV Type: Peripheral, IV Location: R Antecubital  Inflectra, Dose: 400 mg  Infusion Start Time: 1334  Infusion Stop Time: 1554  Patient refused pre-medications. Nurse educated patient and stressed the importance of taking pre-medications as a precaution in the event of a medication reaction. Patient verbalized understanding.  Post Infusion IV Care: Patient declined observation and Peripheral IV Discontinued  Discharge: Condition: Good, Destination: Home . AVS denied. Performed by:  Garnette Czech, RN

## 2023-12-21 NOTE — Progress Notes (Signed)
 Gregory Meyer, CMA,acting as a Neurosurgeon for Gregory Felts, FNP.,have documented all relevant documentation on the behalf of Gregory Felts, FNP,as directed by  Gregory Felts, FNP while in the presence of Gregory Felts, FNP.  Subjective:   Patient ID: Gregory Meyer , male    DOB: 1981/06/18 , 43 y.o.   MRN: [id number]  Chief Complaint  Patient presents with   Annual Exam    HPI  Patient presents today for HM, Patient reports compliance with medication. Patient denies any chest pain, SOB, or headaches. Patient has no concerns today. He continues to go to Dr. Allegra Meyer he had been off his meds for a few months but has now been back on it since last week.      Past Medical History:  Diagnosis Date   Crohn's disease (HCC)    Crohn's disease of large intestine with rectal bleeding (HCC) 01/29/2014   Crohn's disease with complication (HCC)    Inguinal hernia 01/2014   left   Seasonal allergies      Family History  Problem Relation Age of Onset   Cancer Mother    Hypertension Mother    Diabetes Father      Current Outpatient Medications:    inFLIXimab-dyyb (INFLECTRA) 100 MG SOLR, Infuse 5mg /KG at week 0,2,6 and then every 8 weeks, Disp: 3 each, Rfl: 10   No Known Allergies   Men's preventive visit. Patient Health Questionnaire (PHQ-2) is  Flowsheet Row Office Visit from 12/22/2023 in [facility]  PHQ-2 Total Score 0     Patient is on a Regular diet.  He does try to stay away from beef and pork. Has been cooking more vegetables.  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 Marital status: Married. Relevant history for alcohol use is:  Social History   Substance and Sexual Activity  Alcohol Use Yes   Alcohol/week: 3.0 standard drinks of alcohol   Types: 3 Standard drinks or equivalent per week  Relevant history for tobacco use is:  Social History   Tobacco Use  Smoking Status Never  Smokeless Tobacco Never  .   Review of Systems   Constitutional: Negative.   HENT: Negative.    Eyes: Negative.   Respiratory: Negative.    Cardiovascular: Negative.   Gastrointestinal: Negative.   Endocrine: Negative.   Genitourinary: Negative.   Musculoskeletal: Negative.   Skin:  Negative for rash.  Allergic/Immunologic: Negative.   Neurological: Negative.   Hematological: Negative.   Psychiatric/Behavioral: Negative.       Today's Vitals   12/22/23 1112  BP: 120/80  Pulse: 68  Temp: 98 F (36.7 C)  TempSrc: Oral  Weight: 174 lb 9.6 oz (79.2 kg)  Height: 6' (1.829 m)  PainSc: 0-No pain   Body mass index is 23.68 kg/m.  Wt Readings from Last 3 Encounters:  12/27/23 176 lb (79.8 kg)  12/22/23 174 lb 9.6 oz (79.2 kg)  12/10/23 178 lb 12.8 oz (81.1 kg)    Objective:  Physical Exam Vitals reviewed. Exam conducted with a chaperone present.  Constitutional:      General: He is not in acute distress.    Appearance: Normal appearance.     Comments: thin  HENT:     Head: Normocephalic and atraumatic.     Right Ear: Tympanic membrane, ear canal and external ear normal. There is no impacted cerumen.     Left Ear: External ear normal. There is impacted cerumen.  Eyes:     Extraocular  Movements: Extraocular movements intact.     Conjunctiva/sclera: Conjunctivae normal.     Pupils: Pupils are equal, round, and reactive to light.  Cardiovascular:     Rate and Rhythm: Normal rate and regular rhythm.     Pulses: Normal pulses.     Heart sounds: Normal heart sounds. No murmur heard. Pulmonary:     Effort: Pulmonary effort is normal. No respiratory distress.     Breath sounds: Normal breath sounds.  Abdominal:     General: Abdomen is flat. Bowel sounds are normal. There is no distension.     Palpations: Abdomen is soft. There is no mass.  Genitourinary:    Penis: Normal.      Rectum: Tenderness, external hemorrhoid and internal hemorrhoid present.     Comments: Unable to assess prostate due to the severity of his  hemorrhoids.  Musculoskeletal:        General: No tenderness. Normal range of motion.     Cervical back: Normal range of motion and neck supple.  Skin:    General: Skin is warm and dry.     Capillary Refill: Capillary refill takes less than 2 seconds.     Findings: No lesion.  Neurological:     General: No focal deficit present.     Mental Status: He is alert and oriented to person, place, and time.  Psychiatric:        Mood and Affect: Mood normal.        Behavior: Behavior normal.        Thought Content: Thought content normal.        Judgment: Judgment normal.      Assessment And Plan:    Encounter for annual health examination Assessment & Plan: Behavior modifications discussed and diet history reviewed.   Pt will continue to exercise regularly and modify diet with low GI, plant based foods and decrease intake of processed foods.  Recommend intake of daily multivitamin, Vitamin D, and calcium.  Recommend for preventive screenings, as well as recommend immunizations that include influenza, TDAP    Vitamin D deficiency Assessment & Plan: Will check vitamin D level and supplement as needed.    Also encouraged to spend 15 minutes in the sun daily.    Orders: -     VITAMIN D 25 Hydroxy (Vit-D Deficiency, Fractures)  Perianal Crohn's disease, with fistula (HCC) Assessment & Plan: Continue f/u with GI  Orders: -     CMP14+EGFR  Grade IV hemorrhoids Assessment & Plan: Significant hemorrhoids, denies pain. They are likely related to his Chron's. He continues to be followed by GI   Impacted cerumen of left ear Assessment & Plan: Wax is removed by with lavage with elephant ear with 1/2 water and 1/2 peroxide. Instructions for home care to prevent wax buildup are given.   Orders: -     Ear Lavage  COVID-19 vaccination declined Assessment & Plan: Declines covid 19 vaccine. Discussed risk of covid 42 and if he changes her mind about the vaccine to call the office.  Education has been provided regarding the importance of this vaccine but patient still declined. Advised may receive this vaccine at local pharmacy or Health Dept.or vaccine clinic. Aware to provide a copy of the vaccination record if obtained from local pharmacy or Health Dept.  Encouraged to take multivitamin, vitamin d, vitamin c and zinc to increase immune system. Aware can call office if would like to have vaccine here at office. Verbalized acceptance and understanding.  Influenza vaccination declined Assessment & Plan: Patient declined influenza vaccination at this time. Patient is aware that influenza vaccine prevents illness in 70% of healthy people, and reduces hospitalizations to 30-70% in elderly. This vaccine is recommended annually. Education has been provided regarding the importance of this vaccine but patient still declined. Advised may receive this vaccine at local pharmacy or Health Dept.or vaccine clinic. Aware to provide a copy of the vaccination record if obtained from local pharmacy or Health Dept.  Pt is willing to accept risk associated with refusing vaccination.    Other long term (current) drug therapy -     CBC with Differential/Platelet  Encounter for hepatitis C screening test for low risk patient Assessment & Plan: Will check Hepatitis C screening due to recent recommendations to screen all adults 18 years and older   Orders: -     Hepatitis C antibody  Encounter for screening for lipid disorder -     Lipid panel  Encounter for screening for metabolic disorder -     Hemoglobin A1c  Encounter for prostate cancer screening -     PSA   Return for 1 year physical.  Patient was given opportunity to ask questions. Patient verbalized understanding of the plan and was able to repeat key elements of the plan. All questions were answered to their satisfaction.   Gregory Felts, FNP  I, Gregory Felts, FNP, have reviewed all documentation for this visit. The  documentation on 12/22/23 for the exam, diagnosis, procedures, and orders are all accurate and complete.

## 2023-12-22 ENCOUNTER — Ambulatory Visit (INDEPENDENT_AMBULATORY_CARE_PROVIDER_SITE_OTHER): Payer: Self-pay | Admitting: Nurse Practitioner

## 2023-12-22 ENCOUNTER — Telehealth: Payer: Self-pay | Admitting: Pharmacy Technician

## 2023-12-22 ENCOUNTER — Telehealth: Payer: Self-pay

## 2023-12-22 ENCOUNTER — Encounter: Payer: Self-pay | Admitting: Pulmonary Disease

## 2023-12-22 ENCOUNTER — Encounter: Payer: Self-pay | Admitting: Nurse Practitioner

## 2023-12-22 VITALS — BP 120/80 | HR 68 | Temp 98.0°F | Ht 72.0 in | Wt 174.6 lb

## 2023-12-22 DIAGNOSIS — K643 Fourth degree hemorrhoids: Secondary | ICD-10-CM

## 2023-12-22 DIAGNOSIS — Z0000 Encounter for general adult medical examination without abnormal findings: Secondary | ICD-10-CM

## 2023-12-22 DIAGNOSIS — Z1159 Encounter for screening for other viral diseases: Secondary | ICD-10-CM | POA: Diagnosis not present

## 2023-12-22 DIAGNOSIS — H6122 Impacted cerumen, left ear: Secondary | ICD-10-CM | POA: Diagnosis not present

## 2023-12-22 DIAGNOSIS — K50113 Crohn's disease of large intestine with fistula: Secondary | ICD-10-CM | POA: Diagnosis not present

## 2023-12-22 DIAGNOSIS — Z2821 Immunization not carried out because of patient refusal: Secondary | ICD-10-CM | POA: Diagnosis not present

## 2023-12-22 DIAGNOSIS — K603 Anal fistula, unspecified: Secondary | ICD-10-CM | POA: Diagnosis not present

## 2023-12-22 DIAGNOSIS — Z79899 Other long term (current) drug therapy: Secondary | ICD-10-CM | POA: Diagnosis not present

## 2023-12-22 DIAGNOSIS — Z13220 Encounter for screening for lipoid disorders: Secondary | ICD-10-CM

## 2023-12-22 DIAGNOSIS — E559 Vitamin D deficiency, unspecified: Secondary | ICD-10-CM

## 2023-12-22 DIAGNOSIS — Z13228 Encounter for screening for other metabolic disorders: Secondary | ICD-10-CM

## 2023-12-22 DIAGNOSIS — Z125 Encounter for screening for malignant neoplasm of prostate: Secondary | ICD-10-CM | POA: Diagnosis not present

## 2023-12-22 NOTE — Telephone Encounter (Addendum)
 Auth Submission: APPROVED  Site of care: Site of care: [facility] Payer: INFECTRA Medication & CPT/J Code(s) submitted: Inflectra  (Infliximab -abda) [id number] Route of submission (phone, fax, portal):  Phone # Fax # Auth type: white bagging- pharmacy benefit Units/visits requested: 5MG /KG 400MG  [id number] Reference number:  Approval from: 11/14/23 to 11/13/24

## 2023-12-22 NOTE — Patient Instructions (Signed)
 Health Maintenance  Topic Date Due   COVID-19 Vaccine (1) Never done   Hepatitis C Screening  Never done   Colon Cancer Screening  03/19/2023   Flu Shot  01/24/2024*   DTaP/Tdap/Td vaccine (2 - Td or Tdap) 06/25/2026   HIV Screening  Completed   Pneumococcal Vaccination  Aged Out   HPV Vaccine  Aged Out  *Topic was postponed. The date shown is not the original due date.

## 2023-12-22 NOTE — Telephone Encounter (Signed)
 Patient states since he started the Inflectra he has had constipation. He states he is having 3 hard bowel movements a day but they are hard and incomplete. Per [name] he can use Miralax 1 to 2 cupfuls daily in a 8 ounce glass of water. Informed him to be drinking 6 to 8 glass of water a day

## 2023-12-23 LAB — LIPID PANEL
Chol/HDL Ratio: 2.8 {ratio} (ref 0.0–5.0)
Cholesterol, Total: 194 mg/dL (ref 100–199)
HDL: 69 mg/dL (ref >39)
LDL Chol Calc (NIH): 118 mg/dL — ABNORMAL HIGH (ref 0–99)
Triglycerides: 38 mg/dL (ref 0–149)
VLDL Cholesterol Cal: 7 mg/dL (ref 5–40)

## 2023-12-23 LAB — CMP14+EGFR
ALT: 15 [IU]/L (ref 0–44)
AST: 19 [IU]/L (ref 0–40)
Albumin: 4.5 g/dL (ref 4.1–5.1)
Alkaline Phosphatase: 89 [IU]/L (ref 44–121)
BUN/Creatinine Ratio: 16 (ref 9–20)
BUN: 16 mg/dL (ref 6–24)
Bilirubin Total: 0.4 mg/dL (ref 0.0–1.2)
CO2: 26 mmol/L (ref 20–29)
Calcium: 9.8 mg/dL (ref 8.7–10.2)
Chloride: 103 mmol/L (ref 96–106)
Creatinine, Ser: 1.03 mg/dL (ref 0.76–1.27)
Globulin, Total: 2.9 g/dL (ref 1.5–4.5)
Glucose: 87 mg/dL (ref 70–99)
Potassium: 5.1 mmol/L (ref 3.5–5.2)
Sodium: 142 mmol/L (ref 134–144)
Total Protein: 7.4 g/dL (ref 6.0–8.5)
eGFR: 94 mL/min/{1.73_m2} (ref >59)

## 2023-12-23 LAB — HEMOGLOBIN A1C
Est. average glucose Bld gHb Est-mCnc: 108 mg/dL
Hgb A1c MFr Bld: 5.4 % (ref 4.8–5.6)

## 2023-12-23 LAB — CBC WITH DIFFERENTIAL/PLATELET
Basophils Absolute: 0 10*3/uL (ref 0.0–0.2)
Basos: 1 %
EOS (ABSOLUTE): 0.1 10*3/uL (ref 0.0–0.4)
Eos: 2 %
Hematocrit: 41.5 % (ref 37.5–51.0)
Hemoglobin: 13.3 g/dL (ref 13.0–17.7)
Immature Grans (Abs): 0 10*3/uL (ref 0.0–0.1)
Immature Granulocytes: 0 %
Lymphocytes Absolute: 1.4 10*3/uL (ref 0.7–3.1)
Lymphs: 40 %
MCH: 30.2 pg (ref 26.6–33.0)
MCHC: 32 g/dL (ref 31.5–35.7)
MCV: 94 fL (ref 79–97)
Monocytes Absolute: 0.3 10*3/uL (ref 0.1–0.9)
Monocytes: 8 %
Neutrophils Absolute: 1.8 10*3/uL (ref 1.4–7.0)
Neutrophils: 49 %
Platelets: 295 10*3/uL (ref 150–450)
RBC: 4.41 x10E6/uL (ref 4.14–5.80)
RDW: 14.1 % (ref 11.6–15.4)
WBC: 3.6 10*3/uL (ref 3.4–10.8)

## 2023-12-23 LAB — PSA: Prostate Specific Ag, Serum: 1 ng/mL (ref 0.0–4.0)

## 2023-12-23 LAB — HEPATITIS C ANTIBODY: Hep C Virus Ab: NONREACTIVE

## 2023-12-23 LAB — VITAMIN D 25 HYDROXY (VIT D DEFICIENCY, FRACTURES): Vit D, 25-Hydroxy: 22.9 ng/mL — ABNORMAL LOW (ref 30.0–100.0)

## 2023-12-27 ENCOUNTER — Ambulatory Visit (INDEPENDENT_AMBULATORY_CARE_PROVIDER_SITE_OTHER): Payer: 59

## 2023-12-27 ENCOUNTER — Telehealth: Payer: Self-pay | Admitting: Gastroenterology

## 2023-12-27 VITALS — BP 142/96 | HR 66 | Temp 98.1°F | Resp 18 | Ht 72.0 in | Wt 176.0 lb

## 2023-12-27 DIAGNOSIS — K5010 Crohn's disease of large intestine without complications: Secondary | ICD-10-CM | POA: Diagnosis not present

## 2023-12-27 MED ORDER — DIPHENHYDRAMINE HCL 25 MG PO CAPS: 25.0000 mg | ORAL_CAPSULE | Freq: Once | ORAL | Status: DC

## 2023-12-27 MED ORDER — ACETAMINOPHEN 325 MG PO TABS: 650.0000 mg | ORAL_TABLET | Freq: Once | ORAL | Status: DC

## 2023-12-27 MED ORDER — METHYLPREDNISOLONE SODIUM SUCC 40 MG IJ SOLR: 40.0000 mg | Freq: Once | INTRAMUSCULAR | Status: DC

## 2023-12-27 MED ORDER — SODIUM CHLORIDE 0.9 % IV SOLN
5.0000 mg/kg | Freq: Once | INTRAVENOUS | Status: AC
  Administered 2023-12-27: 400 mg via INTRAVENOUS
  Filled 2023-12-27: qty 40

## 2023-12-27 NOTE — Progress Notes (Signed)
 Diagnosis: Crohn's Disease  Provider:  [place] Greathouse MD  Procedure: IV Infusion  IV Type: Peripheral, IV Location: R Antecubital  Infliximab-dyyb (inflectra), Dose: 400 mg  Infusion Start Time: 1039  Infusion Stop Time: 1248  Post Infusion IV Care: Peripheral IV Discontinued  Discharge: Condition: Good, Destination: Home . AVS Provided  Performed by:  Rico Ala, LPN   Patient refused pre-medications. Nurse educated patient and stressed the importance of taking pre-medications as a precaution in the event of a medication reaction. Patient verbalized understanding.

## 2023-12-27 NOTE — Telephone Encounter (Signed)
 The patient wanted to know why they trying to schedule him for 8 weeks and not 4 weeks since this is his ramp up period. Please advise for the patient.

## 2023-12-30 ENCOUNTER — Encounter: Payer: Self-pay | Admitting: Nurse Practitioner

## 2023-12-30 ENCOUNTER — Other Ambulatory Visit: Payer: Self-pay

## 2023-12-30 DIAGNOSIS — H6122 Impacted cerumen, left ear: Secondary | ICD-10-CM | POA: Insufficient documentation

## 2023-12-30 NOTE — Assessment & Plan Note (Signed)

## 2023-12-30 NOTE — Assessment & Plan Note (Signed)
 Will check Hepatitis C screening due to recent recommendations to screen all adults 18 years and older

## 2023-12-30 NOTE — Assessment & Plan Note (Signed)
 Wax is removed by with lavage with elephant ear with 1/2 water and 1/2 peroxide. Instructions for home care to prevent wax buildup are given.

## 2023-12-30 NOTE — Assessment & Plan Note (Signed)

## 2023-12-30 NOTE — Assessment & Plan Note (Signed)
 Significant hemorrhoids, denies pain. They are likely related to his Chron's. He continues to be followed by GI

## 2023-12-30 NOTE — Assessment & Plan Note (Signed)
 Behavior modifications discussed and diet history reviewed.   Pt will continue to exercise regularly and modify diet with low GI, plant based foods and decrease intake of processed foods.  Recommend intake of daily multivitamin, Vitamin D, and calcium.  Recommend for preventive screenings, as well as recommend immunizations that include influenza, TDAP

## 2023-12-30 NOTE — Assessment & Plan Note (Signed)
 Will check vitamin D level and supplement as needed.    Also encouraged to spend 15 minutes in the sun daily.

## 2023-12-30 NOTE — Assessment & Plan Note (Signed)
 Continue f/u with GI

## 2024-01-05 ENCOUNTER — Other Ambulatory Visit: Payer: Self-pay | Admitting: Nurse Practitioner

## 2024-01-05 DIAGNOSIS — E559 Vitamin D deficiency, unspecified: Secondary | ICD-10-CM

## 2024-01-05 MED ORDER — VITAMIN D (ERGOCALCIFEROL) 1.25 MG (50000 UNIT) PO CAPS: 50000.0000 [IU] | ORAL_CAPSULE | ORAL | 1 refills | Status: DC

## 2024-01-24 ENCOUNTER — Ambulatory Visit (INDEPENDENT_AMBULATORY_CARE_PROVIDER_SITE_OTHER)

## 2024-01-24 VITALS — BP 130/86 | HR 62 | Temp 98.3°F | Resp 16 | Ht 72.0 in | Wt 177.6 lb

## 2024-01-24 DIAGNOSIS — K5010 Crohn's disease of large intestine without complications: Secondary | ICD-10-CM

## 2024-01-24 MED ORDER — SODIUM CHLORIDE 0.9 % IV SOLN
5.0000 mg/kg | Freq: Once | INTRAVENOUS | Status: AC
  Administered 2024-01-24: 400 mg via INTRAVENOUS
  Filled 2024-01-24: qty 40

## 2024-01-24 MED ORDER — METHYLPREDNISOLONE SODIUM SUCC 40 MG IJ SOLR: 40.0000 mg | Freq: Once | INTRAMUSCULAR | Status: DC

## 2024-01-24 MED ORDER — ACETAMINOPHEN 325 MG PO TABS
650.0000 mg | ORAL_TABLET | Freq: Once | ORAL | Status: DC
Start: 2024-01-24 — End: 2024-01-24

## 2024-01-24 MED ORDER — DIPHENHYDRAMINE HCL 25 MG PO CAPS: 25.0000 mg | ORAL_CAPSULE | Freq: Once | ORAL | Status: DC

## 2024-01-24 NOTE — Progress Notes (Signed)
 Diagnosis: Crohn's Disease  Provider:  Chilton Greathouse MD  Procedure: IV Infusion  IV Type: Peripheral, IV Location: R Antecubital  Inflectra, Dose: 400 mg  Infusion Start Time: 1039  Infusion Stop Time: 1300  Post Infusion IV Care: Peripheral IV Discontinued  Discharge: Condition: Good, Destination: Home . AVS Declined  Performed by:  Loney Hering, LPN

## 2024-01-25 NOTE — Telephone Encounter (Signed)
 Patient has next infusion set up at the infusion site on 03/27/2024. They are not doing home infusion anymore.

## 2024-02-01 ENCOUNTER — Other Ambulatory Visit: Payer: Self-pay | Admitting: Family Medicine

## 2024-02-01 DIAGNOSIS — E559 Vitamin D deficiency, unspecified: Secondary | ICD-10-CM

## 2024-03-27 ENCOUNTER — Ambulatory Visit

## 2024-03-27 MED ORDER — METHYLPREDNISOLONE SODIUM SUCC 40 MG IJ SOLR: 40.0000 mg | Freq: Once | INTRAMUSCULAR | Status: DC

## 2024-03-27 MED ORDER — DIPHENHYDRAMINE HCL 25 MG PO CAPS
25.0000 mg | ORAL_CAPSULE | Freq: Once | ORAL | Status: DC
Start: 2024-03-27 — End: 2024-03-27

## 2024-03-27 MED ORDER — ACETAMINOPHEN 325 MG PO TABS
650.0000 mg | ORAL_TABLET | Freq: Once | ORAL | Status: DC
Start: 2024-03-27 — End: 2024-03-27

## 2024-03-28 ENCOUNTER — Encounter: Payer: Self-pay | Admitting: Pulmonary Disease

## 2024-03-28 ENCOUNTER — Ambulatory Visit (INDEPENDENT_AMBULATORY_CARE_PROVIDER_SITE_OTHER)

## 2024-03-28 VITALS — BP 166/99 | HR 60 | Temp 98.2°F | Resp 16 | Ht 72.0 in | Wt 181.6 lb

## 2024-03-28 DIAGNOSIS — K5010 Crohn's disease of large intestine without complications: Secondary | ICD-10-CM

## 2024-03-28 MED ORDER — ACETAMINOPHEN 325 MG PO TABS: 650.0000 mg | ORAL_TABLET | Freq: Once | ORAL | Status: DC

## 2024-03-28 MED ORDER — DIPHENHYDRAMINE HCL 25 MG PO CAPS: 25.0000 mg | ORAL_CAPSULE | Freq: Once | ORAL | Status: DC

## 2024-03-28 MED ORDER — SODIUM CHLORIDE 0.9 % IV SOLN
5.0000 mg/kg | Freq: Once | INTRAVENOUS | Status: AC
  Administered 2024-03-28: 400 mg via INTRAVENOUS
  Filled 2024-03-28: qty 40

## 2024-03-28 MED ORDER — METHYLPREDNISOLONE SODIUM SUCC 40 MG IJ SOLR: 40.0000 mg | Freq: Once | INTRAMUSCULAR | Status: DC

## 2024-03-28 NOTE — Progress Notes (Signed)
 Diagnosis: Crohn's Disease  Provider:  Praveen Mannam MD  Procedure: IV Infusion  IV Type: Peripheral, IV Location: R Antecubital  Infliximab -dyyb (Inflectra ), Dose: 400 mg  Infusion Start Time: 0920  Infusion Stop Time: 1130  Post Infusion IV Care: Peripheral IV Discontinued  Discharge: Condition: Good, Destination: Home . AVS Declined  Performed by:  Lauran Pollard, LPN   Patient refused pre-medications. Nurse educated patient and stressed the importance of taking pre-medications as a precaution in the event of a medication reaction. Patient verbalized understanding.

## 2024-04-05 DIAGNOSIS — K5010 Crohn's disease of large intestine without complications: Secondary | ICD-10-CM | POA: Diagnosis not present

## 2024-05-22 ENCOUNTER — Ambulatory Visit (INDEPENDENT_AMBULATORY_CARE_PROVIDER_SITE_OTHER)

## 2024-05-22 VITALS — BP 129/74 | HR 78 | Temp 97.9°F | Resp 16 | Ht 72.0 in | Wt 177.0 lb

## 2024-05-22 DIAGNOSIS — K5010 Crohn's disease of large intestine without complications: Secondary | ICD-10-CM | POA: Diagnosis not present

## 2024-05-22 MED ORDER — DIPHENHYDRAMINE HCL 25 MG PO CAPS: 25.0000 mg | ORAL_CAPSULE | Freq: Once | ORAL | Status: DC

## 2024-05-22 MED ORDER — METHYLPREDNISOLONE SODIUM SUCC 40 MG IJ SOLR: 40.0000 mg | Freq: Once | INTRAMUSCULAR | Status: DC

## 2024-05-22 MED ORDER — ACETAMINOPHEN 325 MG PO TABS
650.0000 mg | ORAL_TABLET | Freq: Once | ORAL | Status: DC
Start: 2024-05-22 — End: 2024-05-22

## 2024-05-22 MED ORDER — SODIUM CHLORIDE 0.9 % IV SOLN
5.0000 mg/kg | Freq: Once | INTRAVENOUS | Status: AC
  Administered 2024-05-22: 400 mg via INTRAVENOUS
  Filled 2024-05-22: qty 40

## 2024-05-22 NOTE — Progress Notes (Signed)
 Diagnosis: Crohn's Disease  Provider:  Mannam, Praveen MD  Procedure: IV Infusion  IV Type: Peripheral, IV Location: R Antecubital  Inflectra  (infliximab -dyyb), Dose: 400 mg  Infusion Start Time: 1019  Infusion Stop Time: 1232  Post Infusion IV Care: Peripheral IV Discontinued  Discharge: Condition: Good, Destination: Home . AVS Declined  Performed by:  Rocky FORBES Sar, RN

## 2024-07-12 ENCOUNTER — Encounter
Admission: RE | Disposition: A | Discharge status: Home / Self Care | Payer: Self-pay | Source: Home / Self Care | Attending: Gastroenterology

## 2024-07-12 ENCOUNTER — Ambulatory Visit
Admission: RE | Admit: 2024-07-12 | Discharge: 2024-07-12 | Disposition: A | Discharge status: Home / Self Care | Attending: Gastroenterology | Admitting: Gastroenterology

## 2024-07-12 ENCOUNTER — Ambulatory Visit: Admitting: Anesthesiology

## 2024-07-12 ENCOUNTER — Encounter: Payer: Self-pay | Admitting: Gastroenterology

## 2024-07-12 DIAGNOSIS — K529 Noninfective gastroenteritis and colitis, unspecified: Secondary | ICD-10-CM | POA: Diagnosis not present

## 2024-07-12 DIAGNOSIS — K5010 Crohn's disease of large intestine without complications: Secondary | ICD-10-CM | POA: Diagnosis not present

## 2024-07-12 DIAGNOSIS — K644 Residual hemorrhoidal skin tags: Secondary | ICD-10-CM | POA: Diagnosis not present

## 2024-07-12 DIAGNOSIS — K50119 Crohn's disease of large intestine with unspecified complications: Secondary | ICD-10-CM | POA: Diagnosis not present

## 2024-07-12 DIAGNOSIS — K5090 Crohn's disease, unspecified, without complications: Secondary | ICD-10-CM | POA: Diagnosis not present

## 2024-07-12 DIAGNOSIS — F1290 Cannabis use, unspecified, uncomplicated: Secondary | ICD-10-CM | POA: Diagnosis not present

## 2024-07-12 HISTORY — PX: COLONOSCOPY: SHX5424

## 2024-07-12 SURGERY — COLONOSCOPY
Anesthesia: General

## 2024-07-12 MED ORDER — PROPOFOL 500 MG/50ML IV EMUL
INTRAVENOUS | Status: DC | PRN
  Administered 2024-07-12: 125 ug/kg/min via INTRAVENOUS

## 2024-07-12 MED ORDER — LIDOCAINE HCL (PF) 2 % IJ SOLN
INTRAMUSCULAR | Status: AC
  Filled 2024-07-12: qty 5

## 2024-07-12 MED ORDER — PROPOFOL 10 MG/ML IV BOLUS
INTRAVENOUS | Status: AC
  Filled 2024-07-12: qty 20

## 2024-07-12 MED ORDER — PROPOFOL 10 MG/ML IV BOLUS
INTRAVENOUS | Status: DC | PRN
  Administered 2024-07-12 (×4): 50 mg via INTRAVENOUS

## 2024-07-12 MED ORDER — MESALAMINE 1000 MG RE SUPP: 1000.0000 mg | Freq: Every day | RECTAL | 0 refills | Status: AC

## 2024-07-12 MED ORDER — LIDOCAINE HCL (CARDIAC) PF 100 MG/5ML IV SOSY
PREFILLED_SYRINGE | INTRAVENOUS | Status: DC | PRN
  Administered 2024-07-12: 60 mg via INTRAVENOUS

## 2024-07-12 MED ORDER — DEXMEDETOMIDINE HCL IN NACL 80 MCG/20ML IV SOLN
INTRAVENOUS | Status: DC | PRN
  Administered 2024-07-12: 8 ug via INTRAVENOUS
  Administered 2024-07-12: 12 ug via INTRAVENOUS

## 2024-07-12 MED ORDER — SODIUM CHLORIDE 0.9 % IV SOLN: INTRAVENOUS | Status: DC | PRN

## 2024-07-12 NOTE — Anesthesia Preprocedure Evaluation (Signed)
 Anesthesia Evaluation  Patient identified by MRN, date of birth, ID band Patient awake    Reviewed: Allergy & Precautions, NPO status , Patient's Chart, lab work & pertinent test results  History of Anesthesia Complications Negative for: history of anesthetic complications  Airway Mallampati: I   Neck ROM: Full    Dental  (+) Chipped, Dental Advidsory Given   Pulmonary neg pulmonary ROS   Pulmonary exam normal breath sounds clear to auscultation       Cardiovascular Exercise Tolerance: Good negative cardio ROS Normal cardiovascular exam Rhythm:Regular Rate:Normal     Neuro/Psych Occasional marijuana use; last intake 03/16/22    GI/Hepatic ,,,(+)     substance abuse  marijuana useCrohn disease   Endo/Other  negative endocrine ROS    Renal/GU negative Renal ROS     Musculoskeletal   Abdominal   Peds  Hematology negative hematology ROS (+)   Anesthesia Other Findings Past Medical History: No date: Crohn's disease (HCC) 01/29/2014: Crohn's disease of large intestine with rectal bleeding  (HCC) No date: Crohn's disease with complication (HCC) 01/2014: Inguinal hernia     Comment:  left No date: Seasonal allergies   Reproductive/Obstetrics                              Anesthesia Physical Anesthesia Plan  ASA: 2  Anesthesia Plan: General   Post-op Pain Management:    Induction: Intravenous  PONV Risk Score and Plan: 2 and Propofol  infusion, TIVA and Treatment may vary due to age or medical condition  Airway Management Planned: Natural Airway  Additional Equipment:   Intra-op Plan:   Post-operative Plan:   Informed Consent: I have reviewed the patients History and Physical, chart, labs and discussed the procedure including the risks, benefits and alternatives for the proposed anesthesia with the patient or authorized representative who has indicated his/her understanding and  acceptance.       Plan Discussed with: CRNA  Anesthesia Plan Comments: (LMA/GETA backup discussed.  Patient consented for risks of anesthesia including but not limited to:  - adverse reactions to medications - damage to eyes, teeth, lips or other oral mucosa - nerve damage due to positioning  - sore throat or hoarseness - damage to heart, brain, nerves, lungs, other parts of body or loss of life  Informed patient about role of CRNA in peri- and intra-operative care.  Patient voiced understanding.)         Anesthesia Quick Evaluation

## 2024-07-12 NOTE — Transfer of Care (Signed)
 Immediate Anesthesia Transfer of Care Note  Patient: Gregory Meyer  Procedure(s) Performed: COLONOSCOPY  Patient Location: PACU  Anesthesia Type:General  Level of Consciousness: sedated  Airway & Oxygen Therapy: Patient Spontanous Breathing  Post-op Assessment: Report given to RN and Post -op Vital signs reviewed and stable  Post vital signs: Reviewed and stable  Last Vitals:  Vitals Value Taken Time  BP 100/55 07/12/24 12:22  Temp    Pulse 81 07/12/24 12:23  Resp 20 07/12/24 12:23  SpO2 96 % 07/12/24 12:23  Vitals shown include unfiled device data.  Last Pain:  Vitals:   07/12/24 1222  TempSrc:   PainSc: Asleep         Complications: No notable events documented.

## 2024-07-12 NOTE — H&P (Signed)
 [name], MD [facility], DHIP [address]  [place], [place] [address]  Main: [phone] Fax:  [phone] Pager: [phone]   Primary Care Physician:  Georgina Speaks, FNP Primary Gastroenterologist:  Dr. [name]  Pre-Procedure History & Physical: HPI:  Gregory Meyer is a 43 y.o. male is here for an colonoscopy.   Past Medical History:  Diagnosis Date   Crohn's disease (HCC)    Crohn's disease of large intestine with rectal bleeding (HCC) 01/29/2014   Crohn's disease with complication (HCC)    Inguinal hernia 01/2014   left   Seasonal allergies     Past Surgical History:  Procedure Laterality Date   COLONOSCOPY     x 2-3   COLONOSCOPY WITH PROPOFOL  N/A 06/15/2019   Procedure: COLONOSCOPY WITH PROPOFOL ;  Surgeon: Brooklyn Corinn Skiff, MD;  Location: [facility] END[facility]COPY;  Service: Gastroenterology;  Laterality: N/A;   COLONOSCOPY WITH PROPOFOL  N/A 10/13/2019   Procedure: COLONOSCOPY WITH PROPOFOL ;  Surgeon: Brooklyn Corinn Skiff, MD;  Location: [facility] END[facility]COPY;  Service: Gastroenterology;  Laterality: N/A;   COLONOSCOPY WITH PROPOFOL  Right 03/18/2022   Procedure: COLONOSCOPY WITH PROPOFOL ;  Surgeon: Brooklyn Corinn Skiff, MD;  Location: [facility] ENDOSCOPY;  Service: Gastroenterology;  Laterality: Right;   FLEXIBLE SIGMOIDOSCOPY N/A 08/06/2020   Procedure: FLEXIBLE SIGMOIDOSCOPY;  Surgeon: Brooklyn Corinn Skiff, MD;  Location: [facility] ENDOSCOPY;  Service: Gastroenterology;  Laterality: N/A;   HERNIA REPAIR Right 2010   INGUINAL HERNIA REPAIR Left 02/28/2014   Procedure: HERNIA REPAIR INGUINAL ADULT;  Surgeon: Elon CHRISTELLA Pacini, MD;  Location: [facility];  Service: General;  Laterality: Left;   INSERTION OF MESH Left 02/28/2014   Procedure: INSERTION OF MESH;  Surgeon: Elon CHRISTELLA Pacini, MD;  Location: [facility];  Service: General;  Laterality: Left;    Prior to Admission medications   Medication Sig Start Date End Date  Taking? Authorizing Provider  inFLIXimab -dyyb (INFLECTRA ) 100 MG SOLR Infuse 5mg /KG at week 0,2,6 and then every 8 weeks 11/16/23   Bro[place]lyn Corinn Skiff, MD  Vitamin D , Ergocalciferol , (DRISDOL ) 1.25 MG (50000 UNIT) CAPS capsule TAKE 1 CAPSULE (50,000 UNITS TOTAL) BY MOUTH EVERY 7 (SEVEN) DAYS 02/02/24   Georgina Speaks, FNP    Allergies as of 06/19/2024   (No Known Allergies)    Family History  Problem Relation Age of Onset   Cancer Mother    Hypertension Mother    Diabetes Father     Social History   Socioeconomic History   Marital status: Married    Spouse name: Not on file   Number of children: Not on file   Years of education: Not on file   Highest education level: Not on file  Occupational History   Not on file  Tobacco Use   Smoking status: Never   Smokeless tobacco: Never  Vaping Use   Vaping status: Never Used  Substance and Sexual Activity   Alcohol use: Yes    Alcohol/week: 3.0 standard drinks of alcohol    Types: 3 Standard drinks or equivalent per week   Drug use: Yes    Types: Marijuana    Comment: [phone]   Sexual activity: Yes  Other Topics Concern   Not on file  Social History Narrative   Not on file   Social Drivers of Health   Financial Resource Strain: Not on file  Food Insecurity: Not on file  Transportation Needs: Not on file  Physical Activity: Not on file  Stress: Not on  file  Social Connections: Not on file  Intimate Partner Violence: Not on file    Review of Systems: See HPI, otherwise negative ROS  Physical Exam: BP 129/85   Pulse 70   Temp (!) 96.5 F (35.8 C) (Temporal)   Resp 16   Wt 72.5 kg   SpO2 98%   BMI 21.67 kg/m  General:   Alert,  pleasant and cooperative in NAD Head:  Normocephalic and atraumatic. Neck:  Supple; no masses or thyromegaly. Lungs:  Clear throughout to auscultation.    Heart:  Regular rate and rhythm. Abdomen:  Soft, nontender and nondistended. Normal bowel sounds, without guarding, and without  rebound.   Neurologic:  Alert and  oriented x4;  grossly normal neurologically.  Impression/Plan: Gregory Meyer is here for an colonoscopy to be performed for Crohn's disease of the colon, perianal Crohn's   Risks, benefits, limitations, and alternatives regarding  colonoscopy have been reviewed with the patient.  Questions have been answered.  All parties agreeable.   [name], MD  07/12/2024, 10:39 AM

## 2024-07-12 NOTE — Op Note (Signed)
 [facility] Gastroenterology Patient Name: Gregory Meyer Procedure Date: 07/12/2024 11:37 AM MRN: [id number] Account #: [id number] Date of Birth: Dec 28, 1980 Admit Type: Outpatient Age: 43 Room: [facility] ENDO ROOM 4 Gender: Male Note Status: Finalized Instrument Name: Peds Colonoscope [id number] Procedure:             Colonoscopy Indications:           Last colonoscopy: May 2023, Crohn's disease of the                         colon, Follow-up of Crohn's disease of the colon,                         Disease activity assessment of Crohn's disease of the                         colon, Assess therapeutic response to therapy of                         Crohn's disease of the colon Providers:             Corinn Jess Brooklyn MD, MD Referring MD:          [name] (Referring MD) Medicines:             General Anesthesia Complications:         No immediate complications. Estimated blood loss: None. Procedure:             Pre-Anesthesia Assessment:                        - Prior to the procedure, a History and Physical was                         performed, and patient medications and allergies were                         reviewed. The patient is competent. The risks and                         benefits of the procedure and the sedation options and                         risks were discussed with the patient. All questions                         were answered and informed consent was obtained.                         Patient identification and proposed procedure were                         verified by the physician, the nurse, the                         anesthesiologist, the anesthetist and the technician                         in the pre-procedure area in the procedure room in the  endoscopy suite. Mental Status Examination: alert and                         oriented. Airway Examination: normal oropharyngeal                         airway and  neck mobility. Respiratory Examination:                         clear to auscultation. CV Examination: normal.                         Prophylactic Antibiotics: The patient does not require                         prophylactic antibiotics. Prior Anticoagulants: The                         patient has taken no anticoagulant or antiplatelet                         agents. ASA Grade Assessment: II - A patient with mild                         systemic disease. After reviewing the risks and                         benefits, the patient was deemed in satisfactory                         condition to undergo the procedure. The anesthesia                         plan was to use general anesthesia. Immediately prior                         to administration of medications, the patient was                         re-assessed for adequacy to receive sedatives. The                         heart rate, respiratory rate, oxygen saturations,                         blood pressure, adequacy of pulmonary ventilation, and                         response to care were monitored throughout the                         procedure. The physical status of the patient was                         re-assessed after the procedure.                        After obtaining informed consent, the colonoscope was  passed under direct vision. Throughout the procedure,                         the patient's blood pressure, pulse, and oxygen                         saturations were monitored continuously. The                         Colonoscope was introduced through the anus and                         advanced to the 10 cm into the ileum. The colonoscopy                         was performed without difficulty. The patient                         tolerated the procedure well. The quality of the bowel                         preparation was evaluated using the BBPS [facility] Bowel                          Preparation Scale) with scores of: Right Colon = 3,                         Transverse Colon = 3 and Left Colon = 3 (entire mucosa                         seen well with no residual staining, small fragments                         of stool or opaque liquid). The total BBPS score                         equals 9. The terminal ileum, ileocecal valve,                         appendiceal orifice, and rectum were photographed. Findings:      Skin tags were found on perianal exam.      The terminal ileum appeared normal.      The colon (entire examined portion) appeared normal. Biopsies were taken       with a cold forceps for histology.      Severe inflammation characterized by friability and shallow ulcerations       was found in the distal rectum. Biopsies were taken with a cold forceps       for histology. Impression:            - Perianal skin tags found on perianal exam.                        - The examined portion of the ileum was normal.                        - The entire examined colon is normal. Biopsied.                        -  Severe inflammation was found in the distal rectum.                         Biopsied. Recommendation:        - Discharge patient to home (with escort).                        - Resume previous diet today.                        - Continue present medications.                        - Await pathology results.                        - Return to my office as previously scheduled. Procedure Code(s):     --- Professional ---                        [phone], Colonoscopy, flexible; with biopsy, single or                         multiple Diagnosis Code(s):     --- Professional ---                        K62.89, Other specified diseases of anus and rectum                        K64.4, Residual hemorrhoidal skin tags                        K50.10, Crohn's disease of large intestine without                         complications CPT copyright 2022 American Medical  Association. All rights reserved. The codes documented in this report are preliminary and upon coder review may  be revised to meet current compliance requirements. Dr. [name] Corinn Jess Brooklyn MD, MD 07/12/2024 12:23:52 PM This report has been signed electronically. Number of Addenda: 0 Note Initiated On: 07/12/2024 11:37 AM Scope Withdrawal Time: 0 hours 15 minutes 36 seconds  Total Procedure Duration: 0 hours 19 minutes 7 seconds  Estimated Blood Loss:  Estimated blood loss: none.      [facility]

## 2024-07-13 ENCOUNTER — Encounter: Payer: Self-pay | Admitting: Gastroenterology

## 2024-07-13 LAB — SURGICAL PATHOLOGY

## 2024-07-13 NOTE — Anesthesia Postprocedure Evaluation (Signed)
 Anesthesia Post Note  Patient: Gregory Meyer  Procedure(s) Performed: COLONOSCOPY  Patient location during evaluation: Endoscopy Anesthesia Type: General Level of consciousness: awake and alert Pain management: pain level controlled Vital Signs Assessment: post-procedure vital signs reviewed and stable Respiratory status: spontaneous breathing, nonlabored ventilation, respiratory function stable and patient connected to nasal cannula oxygen Cardiovascular status: blood pressure returned to baseline and stable Postop Assessment: no apparent nausea or vomiting Anesthetic complications: no   No notable events documented.   Last Vitals:  Vitals:   07/12/24 1242 07/12/24 1252  BP: (!) 118/92 113/77  Pulse:    Resp: 14 15  Temp:    SpO2: 98% 98%    Last Pain:  Vitals:   07/13/24 0737  TempSrc:   PainSc: 0-No pain                 Prentice Murphy

## 2024-07-17 ENCOUNTER — Ambulatory Visit (INDEPENDENT_AMBULATORY_CARE_PROVIDER_SITE_OTHER)

## 2024-07-17 ENCOUNTER — Ambulatory Visit: Payer: Self-pay | Admitting: Gastroenterology

## 2024-07-17 VITALS — BP 128/77 | HR 75 | Temp 98.2°F | Resp 12 | Ht 72.0 in | Wt 166.0 lb

## 2024-07-17 DIAGNOSIS — K5010 Crohn's disease of large intestine without complications: Secondary | ICD-10-CM

## 2024-07-17 MED ORDER — DIPHENHYDRAMINE HCL 25 MG PO CAPS: 25.0000 mg | ORAL_CAPSULE | Freq: Once | ORAL | Status: DC

## 2024-07-17 MED ORDER — ACETAMINOPHEN 325 MG PO TABS: 650.0000 mg | ORAL_TABLET | Freq: Once | ORAL | Status: DC

## 2024-07-17 MED ORDER — METHYLPREDNISOLONE SODIUM SUCC 40 MG IJ SOLR: 40.0000 mg | Freq: Once | INTRAMUSCULAR | Status: DC

## 2024-07-17 MED ORDER — SODIUM CHLORIDE 0.9 % IV SOLN
5.0000 mg/kg | Freq: Once | INTRAVENOUS | Status: AC
  Administered 2024-07-17: 400 mg via INTRAVENOUS
  Filled 2024-07-17: qty 40

## 2024-07-17 NOTE — Progress Notes (Signed)
 Diagnosis: Crohn's Disease  Provider:  Mannam, Praveen MD  Procedure: IV Infusion  IV Type: Peripheral, IV Location: R Antecubital  Inflectra  (infliximab -dyyb), Dose: 400 mg  Infusion Start Time: 1349  Infusion Stop Time: 1600  Post Infusion IV Care: Peripheral IV Discontinued  Discharge: Condition: Good, Destination: Home . AVS Declined  Performed by:  Rocky FORBES Sar, RN

## 2024-07-18 DIAGNOSIS — K50119 Crohn's disease of large intestine with unspecified complications: Secondary | ICD-10-CM | POA: Diagnosis not present

## 2024-07-19 ENCOUNTER — Telehealth: Payer: Self-pay | Admitting: Pharmacy Technician

## 2024-07-19 NOTE — Telephone Encounter (Addendum)
 Auth Submission: APPROVED   Site of care: Site of care: [facility] Payer: [employer] Medication & CPT/J Code(s) submitted: Inflectra  (Infliximab -abda) [id number] Diagnosis Code:  Route of submission (phone, fax, portal): FAX Phone # Fax # Auth type: Buy/Bill PB Units/visits requested: 5mg /kg Q8WKS x7 doses Reference number: [id number] Approval from: 07/17/24 to 07/16/25   This patient has been enrolled into the Pf[employer] copay program, see below. Co-pay Card GRP #  - [id number]  (Inflectra ) Co-pay Card ID #[id number]

## 2024-07-19 NOTE — Telephone Encounter (Signed)
 Thanks for clarifying. Luke

## 2024-07-19 NOTE — Telephone Encounter (Signed)
 Dr. Unk, Patient received Inflectra  treatment yesterday 07/18/24. He mentioned that he spoke with you about increasing his his dose to 10mg /kg q8wks. Can you please verify that his next treatment will be 10/kg q8wks? Otherwise we will continue with current dose of 5mg /kg q8wks.  Thanks Luke

## 2024-07-20 DIAGNOSIS — K5010 Crohn's disease of large intestine without complications: Secondary | ICD-10-CM | POA: Diagnosis not present

## 2024-07-21 ENCOUNTER — Other Ambulatory Visit: Payer: Self-pay | Admitting: Gastroenterology

## 2024-07-21 DIAGNOSIS — K5010 Crohn's disease of large intestine without complications: Secondary | ICD-10-CM

## 2024-07-27 ENCOUNTER — Encounter: Payer: Self-pay | Admitting: Family Medicine

## 2024-07-27 ENCOUNTER — Ambulatory Visit: Admitting: Family Medicine

## 2024-07-27 ENCOUNTER — Ambulatory Visit: Payer: Self-pay

## 2024-07-27 VITALS — BP 110/60 | HR 82 | Temp 99.1°F | Ht 72.0 in | Wt 166.0 lb

## 2024-07-27 DIAGNOSIS — S60861A Insect bite (nonvenomous) of right wrist, initial encounter: Secondary | ICD-10-CM | POA: Diagnosis not present

## 2024-07-27 DIAGNOSIS — T63444A Toxic effect of venom of bees, undetermined, initial encounter: Secondary | ICD-10-CM

## 2024-07-27 DIAGNOSIS — K5010 Crohn's disease of large intestine without complications: Secondary | ICD-10-CM | POA: Diagnosis not present

## 2024-07-27 DIAGNOSIS — W57XXXA Bitten or stung by nonvenomous insect and other nonvenomous arthropods, initial encounter: Secondary | ICD-10-CM | POA: Diagnosis not present

## 2024-07-27 MED ORDER — TRIAMCINOLONE ACETONIDE 40 MG/ML IJ SUSP
60.0000 mg | Freq: Once | INTRAMUSCULAR | Status: AC
  Administered 2024-07-27: 60 mg via INTRAMUSCULAR

## 2024-07-27 MED ORDER — BETAMETHASONE DIPROPIONATE 0.05 % EX CREA: TOPICAL_CREAM | Freq: Two times a day (BID) | CUTANEOUS | 0 refills | Status: AC

## 2024-07-27 NOTE — Telephone Encounter (Signed)
 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 Was stung on left wrist and developed swelling to the area along with left hand. Patient reports taking two benadryl  for itching but reports no other symptoms. Scheduled for a same day appointment-scheduled for today at 4:00 PM in PCP office.   FYI Only or Action Required?: FYI only for provider.  Patient was last seen in primary care on 12/22/2023 by Georgina Speaks, FNP.  Called Nurse Triage reporting Insect Bite.  Symptoms began yesterday.  Interventions attempted: OTC medications: Benadryl  and Rest, hydration, or home remedies.  Symptoms are: unchanged.  Triage Disposition: See Physician Within 24 Hours  Patient/caregiver understands and will follow disposition?: Yes  Copied from CRM #[id number]. Topic: Clinical - Red Word Triage >> Jul 27, 2024 11:59 AM Fonda T wrote: [facility] that prompted transfer to Nurse Triage: Patient calling, states he was stung bee on yesterday on left wrist, and today is having increased swelling in that area and in the left hand, and slight pain. Reason for Disposition  Swelling is huge (e.g., more than 4 inches or 10 cm, spreads beyond wrist or ankle)  Answer Assessment - Initial Assessment Questions 1. TYPE: What type of sting was it? (e.g., bee, yellow jacket, unknown)      Yellow jacket 2. ONSET: When did it occur?      yesterday 3. LOCATION: Where is the sting located?  How many stings?     Left wrist-1 sting 4. SWELLING SIZE: How big is the swelling? (e.g., inches or cm)     Area of swelling to sting is a quarter but reports swelling to hand as well.  5. REDNESS: Is the area red or pink? If Yes, ask: What size is area of redness? (e.g., inches or cm). When did the redness start?     no 6. PAIN: Is there any pain? If Yes, ask: How bad is it?  (Scale 0-10; or none, mild, moderate, severe)     Mild  7. ITCHING: Is there any itching? If Yes, ask: How bad is  it?      Yes-mild 8. RESPIRATORY DISTRESS: Describe your breathing.     Regular breathing-no distress 9. PRIOR REACTIONS: Have you had any severe allergic reactions to stings in the past? If Yes, ask: What happened?     no 10. OTHER SYMPTOMS: Do you have any other symptoms? (e.g., abdomen pain, face or tongue swelling, new rash elsewhere, vomiting)       no  Protocols used: Bee or Yellow Jacket Sting-A-AH

## 2024-07-27 NOTE — Progress Notes (Signed)
 I,Jameka J Llittleton, CMA,acting as a Neurosurgeon for [employer], NP.,have documented all relevant documentation on the behalf of Bruna Creighton, NP,as directed by  Bruna Creighton, NP while in the presence of Bruna Creighton, NP.  Subjective:  Patient ID: Gregory Meyer , male    DOB: May 14, 1981 , 43 y.o.   MRN: [id number]  Chief Complaint  Patient presents with   Insect Bite    Patient presents today for swelling in his hand and wrist. He reports he was stung by a bee yesterday. He reports the swelling is the same. He took benadryl  last night.     HPI Discussed the use of AI scribe software for clinical note transcription with the patient, who gave verbal consent to proceed.  History of Present Illness      Gregory Meyer is a 43 year old male who presents with a bee sting on his wrist.  He was stung by a bee on his wrist yesterday while doing yard work. The sting site is swollen and feels tight when he makes a fist. He describes a 'funny feeling' in his arm, noting increased awareness of it. The area is also intermittently itchy. He took Benadryl  last night but has not taken any medication today. He rates the pain as 2 out of 10 and has not noticed any significant improvement with Benadryl . No previous significant reactions to bee stings.  He has a history of Crohn's disease and receives Inflectra  infusions every eight weeks. He reports that his gastroenterologist told him after a recent colonoscopy that there was severe inflammation near the rectum. He was previously constipated, prompting the colonoscopy, but no active flare-ups were noted. He also reports low iron levels and is awaiting a new prescription for iron supplements.  He has not been taking his prescribed high-dose vitamin D  due to running out of the prescription. He plans to have his vitamin D  levels checked soon. He has been trying to improve his diet, cutting out fried foods and fast food, and aims to become pescatarian. He  only drinks water and avoids beef and pork.     Past Medical History:  Diagnosis Date   Crohn's disease (HCC)    Crohn's disease of large intestine with rectal bleeding (HCC) 01/29/2014   Crohn's disease with complication (HCC)    Inguinal hernia 01/2014   left   Seasonal allergies      Family History  Problem Relation Age of Onset   Cancer Mother    Hypertension Mother    Diabetes Father      Current Outpatient Medications:    betamethasone dipropionate 0.05 % cream, Apply topically 2 (two) times daily., Disp: 30 g, Rfl: 0   inFLIXimab -dyyb (INFLECTRA ) 100 MG SOLR, Infuse 5mg /KG at week 0,2,6 and then every 8 weeks, Disp: 3 each, Rfl: 10   Vitamin D , Ergocalciferol , (DRISDOL ) 1.25 MG (50000 UNIT) CAPS capsule, TAKE 1 CAPSULE (50,000 UNITS TOTAL) BY MOUTH EVERY 7 (SEVEN) DAYS, Disp: 4 capsule, Rfl: 1   mesalamine  (CANASA ) 1000 MG suppository, Place 1 suppository (1,000 mg total) rectally at bedtime. (Patient not taking: Reported on 07/27/2024), Disp: 30 suppository, Rfl: 0   No Known Allergies   Review of Systems  Constitutional: Negative.   HENT: Negative.    Respiratory: Negative.  Negative for choking, shortness of breath and wheezing.   Cardiovascular: Negative.   Skin:  Positive for color change.       Wrist is slightly swollen     Today's Vitals  07/27/24 1616  BP: 110/60  Pulse: 82  Temp: 99.1 F (37.3 C)  TempSrc: Oral  Weight: 166 lb (75.3 kg)  Height: 6' (1.829 m)  PainSc: 2   PainLoc: Wrist   Body mass index is 22.51 kg/m.  Wt Readings from Last 3 Encounters:  07/27/24 166 lb (75.3 kg)  07/17/24 166 lb (75.3 kg)  07/12/24 159 lb 12.8 oz (72.5 kg)    The 10-year ASCVD risk score (Arnett DK, et al., 2019) is: 2.3%   Values used to calculate the score:     Age: 32 years     Clincally relevant sex: Male     Is Non-Hispanic African American: Yes     Diabetic: No     Tobacco smoker: No     Systolic Blood Pressure: 110 mmHg     Is BP treated: No      HDL Cholesterol: 69 mg/dL     Total Cholesterol: 194 mg/dL  Objective:  Physical Exam      Assessment And Plan:  Bee sting, undetermined intent, initial encounter -     Triamcinolone Acetonide  Crohn's disease of large intestine without complication (HCC)  Other orders -     Betamethasone Dipropionate; Apply topically 2 (two) times daily.  Dispense: 30 g; Refill: 0   Assessment and Plan Assessment & Plan Bee sting with localized reaction, right wrist/hand Localized swelling and tightness with minimal itching. Pain 2/10. No stinger present. Benadryl  taken with uncertain effect. - Administer steroid injection to reduce swelling. - Prescribe steroid cream for topical application. - Advise use of ice to manage swelling.  Crohn's disease Severe inflammation towards rectum noted on colonoscopy. Inflectra  effective, no current flare-up. Constipation addressed with gastroenterologist. - Continue Inflectra  infusions every 8 weeks. - Start mesalamine  suppository as prescribed by gastroenterologist.  Vitamin D  deficiency Previous high-dose supplementation. Currently not taking due to prescription expiration. Awaiting blood work to confirm current levels. - Take over-the-counter vitamin D  supplement (2000 units) until further testing. - Await results of upcoming blood work to determine need for high-dose vitamin D  supplementation.  General Health Maintenance Plans to become pescatarian, has cut out fast food, beef, and pork. Only drinks water. - Encourage continuation of healthy dietary changes.   Return if symptoms worsen or fail to improve, for keep next appt.  Patient was given opportunity to ask questions. Patient verbalized understanding of the plan and was able to repeat key elements of the plan. All questions were answered to their satisfaction.    I, Bruna Creighton, NP, have reviewed all documentation for this visit. The documentation on 08/10/24 for the exam, diagnosis,  procedures, and orders are all accurate and complete.   IF YOU HAVE BEEN REFERRED TO A SPECIALIST, IT MAY TAKE 1-2 WEEKS TO SCHEDULE/PROCESS THE REFERRAL. IF YOU HAVE NOT HEARD FROM US /SPECIALIST IN TWO WEEKS, PLEASE GIVE US  A CALL AT [phone].

## 2024-07-31 ENCOUNTER — Ambulatory Visit
Admission: RE | Admit: 2024-07-31 | Discharge: 2024-07-31 | Disposition: A | Discharge status: Home / Self Care | Source: Ambulatory Visit | Attending: Gastroenterology | Admitting: Gastroenterology

## 2024-07-31 DIAGNOSIS — Z8719 Personal history of other diseases of the digestive system: Secondary | ICD-10-CM | POA: Diagnosis not present

## 2024-07-31 DIAGNOSIS — K5010 Crohn's disease of large intestine without complications: Secondary | ICD-10-CM | POA: Insufficient documentation

## 2024-07-31 DIAGNOSIS — D649 Anemia, unspecified: Secondary | ICD-10-CM | POA: Diagnosis not present

## 2024-07-31 DIAGNOSIS — K603 Anal fistula, unspecified: Secondary | ICD-10-CM | POA: Diagnosis not present

## 2024-07-31 MED ORDER — GADOBUTROL 1 MMOL/ML IV SOLN
7.0000 mL | Freq: Once | INTRAVENOUS | Status: AC | PRN
  Administered 2024-07-31: 7 mL via INTRAVENOUS

## 2024-08-01 ENCOUNTER — Ambulatory Visit: Payer: Self-pay | Admitting: Gastroenterology

## 2024-08-10 NOTE — Assessment & Plan Note (Signed)
 Chronic, followed by Gastroenterology.  No current flare ups, continue inflectra  infusions every 8 weeks

## 2024-09-11 ENCOUNTER — Ambulatory Visit

## 2024-09-11 ENCOUNTER — Other Ambulatory Visit: Payer: Self-pay

## 2024-09-11 VITALS — BP 123/77 | HR 92 | Temp 98.5°F | Resp 16 | Ht 72.0 in | Wt 163.0 lb

## 2024-09-11 DIAGNOSIS — K5010 Crohn's disease of large intestine without complications: Secondary | ICD-10-CM

## 2024-09-11 MED ORDER — METHYLPREDNISOLONE SODIUM SUCC 40 MG IJ SOLR: 40.0000 mg | Freq: Once | INTRAMUSCULAR | Status: DC

## 2024-09-11 MED ORDER — SODIUM CHLORIDE 0.9 % IV SOLN
5.0000 mg/kg | Freq: Once | INTRAVENOUS | Status: AC
  Administered 2024-09-11: 400 mg via INTRAVENOUS
  Filled 2024-09-11: qty 40

## 2024-09-11 MED ORDER — DIPHENHYDRAMINE HCL 25 MG PO CAPS: 25.0000 mg | ORAL_CAPSULE | Freq: Once | ORAL | Status: DC

## 2024-09-11 MED ORDER — ACETAMINOPHEN 325 MG PO TABS: 650.0000 mg | ORAL_TABLET | Freq: Once | ORAL | Status: DC

## 2024-09-11 NOTE — Progress Notes (Signed)
 Diagnosis: Crohn's Disease  Provider:  Praveen Mannam MD  Procedure: IV Infusion  IV Type: Peripheral, IV Location: R Forearm  Inflectra , Dose: 400 mg  Infusion Start Time: 1416  Infusion Stop Time: 1628  Post Infusion IV Care: Peripheral IV Discontinued  Discharge: Condition: Good, Destination: Home . AVS Declined  Performed by:  Donny Childes, RN

## 2024-09-16 LAB — INFLIXIMAB LEVEL AND ADA FOR IBD
Infliximab ADA, IBD: <10 [AU] (ref <10)
Infliximab Level, IBD: 5.6 ug/mL

## 2024-10-30 ENCOUNTER — Encounter: Payer: Self-pay | Admitting: Pulmonary Disease

## 2024-11-06 ENCOUNTER — Encounter: Payer: Self-pay | Admitting: Pulmonary Disease

## 2024-11-06 ENCOUNTER — Ambulatory Visit

## 2024-11-06 VITALS — BP 125/80 | HR 76 | Temp 99.5°F | Resp 16 | Ht 72.0 in | Wt 166.4 lb

## 2024-11-06 DIAGNOSIS — K5010 Crohn's disease of large intestine without complications: Secondary | ICD-10-CM

## 2024-11-06 MED ORDER — ACETAMINOPHEN 325 MG PO TABS: 650.0000 mg | ORAL_TABLET | Freq: Once | ORAL | Status: DC

## 2024-11-06 MED ORDER — SODIUM CHLORIDE 0.9 % IV SOLN
5.0000 mg/kg | Freq: Once | INTRAVENOUS | Status: AC
  Administered 2024-11-06: 400 mg via INTRAVENOUS
  Filled 2024-11-06: qty 40

## 2024-11-06 MED ORDER — METHYLPREDNISOLONE SODIUM SUCC 40 MG IJ SOLR: 40.0000 mg | Freq: Once | INTRAMUSCULAR | Status: DC

## 2024-11-06 MED ORDER — DIPHENHYDRAMINE HCL 25 MG PO CAPS: 25.0000 mg | ORAL_CAPSULE | Freq: Once | ORAL | Status: DC

## 2024-11-06 NOTE — Progress Notes (Signed)
 Diagnosis: Crohn's Disease  Provider:  Mannam, Praveen MD  Procedure: IV Infusion  IV Type: Peripheral, IV Location: R Antecubital  Medication: Inflectra  (infliximab -dyyb), Dose: 400 mg  Infusion Start Time: 1211  Infusion Stop Time: 1425  Post Infusion IV Care: Peripheral IV Discontinued  Discharge: Condition: Good, Destination: Home . AVS Declined  Performed by:  Rocky FORBES Search, RN

## 2024-11-27 ENCOUNTER — Encounter: Payer: Self-pay | Admitting: Pulmonary Disease

## 2024-11-28 ENCOUNTER — Other Ambulatory Visit: Payer: Self-pay | Admitting: Nurse Practitioner

## 2024-11-28 DIAGNOSIS — E559 Vitamin D deficiency, unspecified: Secondary | ICD-10-CM

## 2024-12-25 ENCOUNTER — Encounter: Payer: 59 | Admitting: Nurse Practitioner

## 2025-01-01 ENCOUNTER — Ambulatory Visit: Payer: Self-pay
